# Patient Record
Sex: Female | Born: 1955 | Race: White | Hispanic: No | Smoking: Never smoker
Health system: Southern US, Community
[De-identification: ages and names within clinical notes are randomized; demographics above are authoritative.]

## PROBLEM LIST (undated history)

## (undated) DIAGNOSIS — E785 Hyperlipidemia, unspecified: Secondary | ICD-10-CM

## (undated) DIAGNOSIS — G4700 Insomnia, unspecified: Secondary | ICD-10-CM

## (undated) DIAGNOSIS — G5763 Lesion of plantar nerve, bilateral lower limbs: Secondary | ICD-10-CM

## (undated) DIAGNOSIS — M1990 Unspecified osteoarthritis, unspecified site: Secondary | ICD-10-CM

## (undated) DIAGNOSIS — T7840XA Allergy, unspecified, initial encounter: Secondary | ICD-10-CM

## (undated) DIAGNOSIS — F419 Anxiety disorder, unspecified: Secondary | ICD-10-CM

## (undated) DIAGNOSIS — B009 Herpesviral infection, unspecified: Secondary | ICD-10-CM

## (undated) HISTORY — DX: Herpesviral infection, unspecified: B00.9

## (undated) HISTORY — DX: Allergy, unspecified, initial encounter: T78.40XA

## (undated) HISTORY — DX: Unspecified osteoarthritis, unspecified site: M19.90

## (undated) HISTORY — DX: Hyperlipidemia, unspecified: E78.5

## (undated) HISTORY — DX: Anxiety disorder, unspecified: F41.9

## (undated) HISTORY — DX: Insomnia, unspecified: G47.00

## (undated) HISTORY — PX: EXCISION MORTON'S NEUROMA: SHX5013

## (undated) HISTORY — PX: COSMETIC SURGERY: SHX468

## (undated) HISTORY — PX: COLONOSCOPY: SHX174

## (undated) HISTORY — DX: Lesion of plantar nerve, bilateral lower limbs: G57.63

---

## 1986-12-13 HISTORY — PX: AUGMENTATION MAMMAPLASTY: SUR837

## 2013-03-14 DIAGNOSIS — F411 Generalized anxiety disorder: Secondary | ICD-10-CM | POA: Insufficient documentation

## 2014-03-26 DIAGNOSIS — A6000 Herpesviral infection of urogenital system, unspecified: Secondary | ICD-10-CM | POA: Insufficient documentation

## 2019-08-13 DIAGNOSIS — Z86010 Personal history of colonic polyps: Secondary | ICD-10-CM | POA: Insufficient documentation

## 2020-08-15 DIAGNOSIS — F1320 Sedative, hypnotic or anxiolytic dependence, uncomplicated: Secondary | ICD-10-CM | POA: Insufficient documentation

## 2020-12-09 ENCOUNTER — Other Ambulatory Visit: Payer: Self-pay

## 2020-12-09 ENCOUNTER — Encounter: Payer: Self-pay | Admitting: Medical-Surgical

## 2020-12-09 ENCOUNTER — Ambulatory Visit (INDEPENDENT_AMBULATORY_CARE_PROVIDER_SITE_OTHER): Payer: PPO | Admitting: Medical-Surgical

## 2020-12-09 VITALS — BP 123/76 | HR 70 | Temp 98.9°F | Ht 66.0 in | Wt 194.4 lb

## 2020-12-09 DIAGNOSIS — G5763 Lesion of plantar nerve, bilateral lower limbs: Secondary | ICD-10-CM | POA: Insufficient documentation

## 2020-12-09 DIAGNOSIS — J302 Other seasonal allergic rhinitis: Secondary | ICD-10-CM | POA: Insufficient documentation

## 2020-12-09 DIAGNOSIS — F5101 Primary insomnia: Secondary | ICD-10-CM | POA: Diagnosis not present

## 2020-12-09 DIAGNOSIS — B009 Herpesviral infection, unspecified: Secondary | ICD-10-CM | POA: Diagnosis not present

## 2020-12-09 DIAGNOSIS — Z7689 Persons encountering health services in other specified circumstances: Secondary | ICD-10-CM | POA: Diagnosis not present

## 2020-12-09 DIAGNOSIS — L659 Nonscarring hair loss, unspecified: Secondary | ICD-10-CM | POA: Diagnosis not present

## 2020-12-09 DIAGNOSIS — Z114 Encounter for screening for human immunodeficiency virus [HIV]: Secondary | ICD-10-CM

## 2020-12-09 DIAGNOSIS — E782 Mixed hyperlipidemia: Secondary | ICD-10-CM | POA: Diagnosis not present

## 2020-12-09 DIAGNOSIS — E785 Hyperlipidemia, unspecified: Secondary | ICD-10-CM | POA: Insufficient documentation

## 2020-12-09 DIAGNOSIS — Z0000 Encounter for general adult medical examination without abnormal findings: Secondary | ICD-10-CM | POA: Diagnosis not present

## 2020-12-09 DIAGNOSIS — Z1159 Encounter for screening for other viral diseases: Secondary | ICD-10-CM

## 2020-12-09 DIAGNOSIS — G4700 Insomnia, unspecified: Secondary | ICD-10-CM | POA: Insufficient documentation

## 2020-12-09 NOTE — Progress Notes (Signed)
New Patient Office Visit  Subjective:  Patient ID: Michelle Mathews, female    DOB: 12/19/1955  Age: 64 y.o. MRN: [id number]  CC:  Chief Complaint  Patient presents with  . Establish Care  . Hyperlipidemia    HPI Michelle Mathews presents to establish care.   Joint/back pain- recently moved and thinks her intermittent joint pains have been related to the increase in activity and lifting. Feeling okay today.   Hair loss- has noted some increased hair loss lately with some slow weight gain.   Insomnia- has recently officially retired and no longer adheres to a strict schedule. Occasional nights where falling asleep is difficult. Takes Lunesta 2mg  nightly as needed on average once weekly. Works very well.   Genital herpes- Takes Valtrex prn, very seldom outbreaks.   HLD- taking Lovastatin 10mg  daily, tolerating well without side effects. Due for lipid check.   Seasonal allergies- taking loratadine 10mg  daily for several years. If she stops, notes that she starts itching. Unsure what she may be allergic to in her environment. Knows she is sensitive to dust.   Past Medical History:  Diagnosis Date  . Herpes simplex   . Hyperlipidemia   . Insomnia   . Morton's neuroma of both feet     Past Surgical History:  Procedure Laterality Date  . BREAST ENHANCEMENT SURGERY    . COLONOSCOPY    . EXCISION MORTON'S NEUROMA Left     Family History  Problem Relation Age of Onset  . Cancer Neg Hx   . Hyperlipidemia Neg Hx   . Hypertension Neg Hx   . Anxiety disorder Neg Hx   . Depression Neg Hx   . Diabetes Neg Hx   . Heart disease Neg Hx   . Kidney disease Neg Hx     Social History   Socioeconomic History  . Marital status: Divorced    Spouse name: Not on file  . Number of children: Not on file  . Years of education: Not on file  . Highest education level: Not on file  Occupational History  . Not on file  Tobacco Use  . Smoking status: Never Smoker  . Smokeless tobacco: Never Used   Vaping Use  . Vaping Use: Never used  Substance and Sexual Activity  . Alcohol use: Yes    Alcohol/week: 1.0 - 2.0 standard drink    Types: 1 - 2 Standard drinks or equivalent per week  . Drug use: Never  . Sexual activity: Not Currently    Partners: Male    Birth control/protection: Post-menopausal  Other Topics Concern  . Not on file  Social History Narrative  . Not on file   Social Determinants of Health   Financial Resource Strain: Not on file  Food Insecurity: Not on file  Transportation Needs: Not on file  Physical Activity: Not on file  Stress: Not on file  Social Connections: Not on file  Intimate Partner Violence: Not on file    ROS Review of Systems  Constitutional: Negative for chills, fatigue, fever and unexpected weight change.  HENT: Negative for congestion, rhinorrhea, sinus pressure and sore throat.   Eyes: Negative for visual disturbance.  Respiratory: Negative for cough, chest tightness and shortness of breath.   Cardiovascular: Negative for chest pain, palpitations and leg swelling.  Gastrointestinal: Negative for abdominal pain, constipation, diarrhea, nausea and vomiting.  Endocrine: Negative for cold intolerance and heat intolerance.  Genitourinary: Negative for dysuria, frequency, urgency, vaginal bleeding and vaginal discharge.  Musculoskeletal: Positive  for arthralgias, back pain and myalgias.  Skin: Negative for rash and wound.  Neurological: Negative for dizziness, light-headedness and headaches.  Hematological: Does not bruise/bleed easily.  Psychiatric/Behavioral: Negative for self-injury, sleep disturbance and suicidal ideas. The patient is not nervous/anxious.     Objective:   Today's Vitals: BP 123/76   Pulse 70   Temp 98.9 F (37.2 C)   Ht 5\' 6"  (1.676 m)   Wt 194 lb 6.4 oz (88.2 kg)   SpO2 96%   BMI 31.38 kg/m   Physical Exam Vitals reviewed.  Constitutional:      General: She is not in acute distress.    Appearance:  Normal appearance.  HENT:     Head: Normocephalic and atraumatic.  Cardiovascular:     Rate and Rhythm: Normal rate and regular rhythm.     Pulses: Normal pulses.     Heart sounds: Normal heart sounds. No murmur heard. No friction rub. No gallop.   Pulmonary:     Effort: Pulmonary effort is normal. No respiratory distress.     Breath sounds: Normal breath sounds. No wheezing.  Skin:    General: Skin is warm and dry.  Neurological:     Mental Status: She is alert and oriented to person, place, and time.  Psychiatric:        Mood and Affect: Mood normal.        Behavior: Behavior normal.        Thought Content: Thought content normal.        Judgment: Judgment normal.     Assessment & Plan:   1. Encounter to establish care Reviewed available information and discussed health care concerns with patient.  We will be requesting records for further review and updated of the chart.  2. Primary insomnia Continue Lunesta 2 mg nightly as needed for sleep.  3. Mixed hyperlipidemia Continue lovastatin 10 mg daily.  Checking lipid panel. - Lipid panel  4. Herpes simplex Continue Valtrex as needed for outbreaks.  5. Hair loss Checking labs including TSH. - CBC - COMPLETE METABOLIC PANEL WITH GFR - TSH  6. Seasonal allergies Continue loratadine 10 mg daily.  If this seems less than effective, may benefit from switching to a different brand of over-the-counter antihistamine for several months before switching back to loratadine.  7. Preventative health care Checking CBCs, CMP, and lipid panel. - CBC - COMPLETE METABOLIC PANEL WITH GFR - Lipid panel  8. Need for hepatitis C screening test Discussed screening recommendations.  She is agreeable so we are adding hepatitis C screening to blood work today. - Hepatitis C antibody  9. Screening for HIV (human immunodeficiency virus) Discussed screening recommendations.  She is also agreeable to this so we are adding this to blood  work today. - HIV Antibody (routine testing w rflx)   Outpatient Encounter Medications as of 12/09/2020  Medication Sig  . CALCIUM PO Take 1 tablet by mouth daily.  . Collagen-Boron-Hyaluronic Acid (MOVE FREE ULTRA JOINT HEALTH) 40-5-3.3 MG TABS Take 1 tablet by mouth daily.  . eszopiclone (LUNESTA) 2 MG TABS tablet Take 2 mg by mouth daily as needed.  . loratadine (CLARITIN) 10 MG tablet Take 1 tablet by mouth daily.  Marland Kitchen lovastatin (MEVACOR) 10 MG tablet Take 10 mg by mouth daily.  . MULTIPLE VITAMIN PO Take 1 tablet by mouth daily.  . valACYclovir (VALTREX) 1000 MG tablet Take 1 tablet by mouth daily as needed.   No facility-administered encounter medications on file as of 12/09/2020.  Follow-up: Return in 6 months (on 06/09/2021) for insomnia/HLD follow up or sooner if needed.   Clearnce Sorrel, DNP, APRN, FNP-BC [facility]

## 2020-12-11 DIAGNOSIS — E782 Mixed hyperlipidemia: Secondary | ICD-10-CM | POA: Diagnosis not present

## 2020-12-11 DIAGNOSIS — Z114 Encounter for screening for human immunodeficiency virus [HIV]: Secondary | ICD-10-CM | POA: Diagnosis not present

## 2020-12-11 DIAGNOSIS — Z1159 Encounter for screening for other viral diseases: Secondary | ICD-10-CM | POA: Diagnosis not present

## 2020-12-11 DIAGNOSIS — Z0000 Encounter for general adult medical examination without abnormal findings: Secondary | ICD-10-CM | POA: Diagnosis not present

## 2020-12-11 DIAGNOSIS — L659 Nonscarring hair loss, unspecified: Secondary | ICD-10-CM | POA: Diagnosis not present

## 2020-12-15 LAB — COMPLETE METABOLIC PANEL WITH GFR
AG Ratio: 1.5 (calc) (ref 1.0–2.5)
ALT: 18 U/L (ref 6–29)
AST: 21 U/L (ref 10–35)
Albumin: 4 g/dL (ref 3.6–5.1)
Alkaline phosphatase (APISO): 66 U/L (ref 37–153)
BUN: 11 mg/dL (ref 7–25)
CO2: 30 mmol/L (ref 20–32)
Calcium: 9.8 mg/dL (ref 8.6–10.4)
Chloride: 105 mmol/L (ref 98–110)
Creat: 0.87 mg/dL (ref 0.50–0.99)
GFR, Est African American: 80 mL/min/{1.73_m2} (ref >=60)
GFR, Est Non African American: 69 mL/min/{1.73_m2} (ref >=60)
Globulin: 2.7 g/dL (calc) (ref 1.9–3.7)
Glucose, Bld: 94 mg/dL (ref 65–99)
Potassium: 4.5 mmol/L (ref 3.5–5.3)
Sodium: 140 mmol/L (ref 135–146)
Total Bilirubin: 0.3 mg/dL (ref 0.2–1.2)
Total Protein: 6.7 g/dL (ref 6.1–8.1)

## 2020-12-15 LAB — LIPID PANEL
Cholesterol: 216 mg/dL — ABNORMAL HIGH (ref <200)
HDL: 68 mg/dL (ref >=50)
LDL Cholesterol (Calc): 118 mg/dL (calc) — ABNORMAL HIGH
Non-HDL Cholesterol (Calc): 148 mg/dL (calc) — ABNORMAL HIGH (ref <130)
Total CHOL/HDL Ratio: 3.2 (calc) (ref <5.0)
Triglycerides: 180 mg/dL — ABNORMAL HIGH (ref <150)

## 2020-12-15 LAB — CBC
HCT: 46 % — ABNORMAL HIGH (ref 35.0–45.0)
Hemoglobin: 15.4 g/dL (ref 11.7–15.5)
MCH: 30.4 pg (ref 27.0–33.0)
MCHC: 33.5 g/dL (ref 32.0–36.0)
MCV: 90.9 fL (ref 80.0–100.0)
MPV: 11.8 fL (ref 7.5–12.5)
Platelets: 363 10*3/uL (ref 140–400)
RBC: 5.06 10*6/uL (ref 3.80–5.10)
RDW: 12.3 % (ref 11.0–15.0)
WBC: 10.1 10*3/uL (ref 3.8–10.8)

## 2020-12-15 LAB — TSH: TSH: 2.18 mIU/L (ref 0.40–4.50)

## 2020-12-15 LAB — HEPATITIS C ANTIBODY
Hepatitis C Ab: NONREACTIVE
SIGNAL TO CUT-OFF: 0.03 (ref <1.00)

## 2020-12-15 LAB — HIV ANTIBODY (ROUTINE TESTING W REFLEX): HIV 1&2 Ab, 4th Generation: NONREACTIVE

## 2020-12-29 ENCOUNTER — Encounter: Payer: PPO | Admitting: Medical-Surgical

## 2020-12-29 NOTE — Progress Notes (Signed)
Erroneous encounter

## 2021-03-09 ENCOUNTER — Ambulatory Visit (INDEPENDENT_AMBULATORY_CARE_PROVIDER_SITE_OTHER): Payer: PPO | Admitting: Medical-Surgical

## 2021-03-09 DIAGNOSIS — Z0000 Encounter for general adult medical examination without abnormal findings: Secondary | ICD-10-CM | POA: Diagnosis not present

## 2021-03-09 DIAGNOSIS — Z1231 Encounter for screening mammogram for malignant neoplasm of breast: Secondary | ICD-10-CM

## 2021-03-09 NOTE — Progress Notes (Signed)
MEDICARE ANNUAL WELLNESS VISIT  03/09/2021  Telephone Visit Disclaimer This Medicare AWV was conducted by telephone due to national recommendations for restrictions regarding the COVID-19 Pandemic (e.g. social distancing).  I verified, using two identifiers, that I am speaking with Michelle Mathews or their authorized healthcare agent. I discussed the limitations, risks, security, and privacy concerns of performing an evaluation and management service by telephone and the potential availability of an in-person appointment in the future. The patient expressed understanding and agreed to proceed.  Location of Patient: Home Location of Provider (nurse):  In the office.  Subjective:    Michelle Mathews is a 65 y.o. female patient of Samuel Bouche, NP who had a Medicare Annual Wellness Visit today via telephone. Michelle Mathews is Retired and lives alone. she has 0 children. she reports that she is socially active and does interact with friends/family regularly. she is minimally physically active and enjoys sewing.  Patient Care Team: Samuel Bouche, NP as PCP - General (Nurse Practitioner)  Advanced Directives 03/09/2021 12/09/2020  Does Patient Have a Medical Advance Directive? Yes Yes  Type of Paramedic of [place];Living will [place];Living will  Does patient want to make changes to medical advance directive? No - Patient declined No - Patient declined  Copy of [place] in Chart? No - copy requested No - copy requested    Hospital Utilization Over the Past 12 Months: # of hospitalizations or ER visits: 0 # of surgeries: 0  Review of Systems    Patient reports that her overall health is unchanged compared to last year.  History obtained from chart review and the patient  Patient Reported Readings (BP, Pulse, CBG, Weight, etc) none  Pain Assessment Pain : No/denies pain     Current Medications & Allergies (verified) Allergies as of  03/09/2021      Reactions   Bee Venom Swelling   Latex Rash   Sunflower Oil Other (See Comments)   Tickling feeling in mouth   Wasp Venom Protein Swelling      Medication List       Accurate as of March 09, 2021 10:33 AM. If you have any questions, ask your nurse or doctor.        CALCIUM PO Take 1 tablet by mouth daily.   cetirizine 10 MG tablet Commonly known as: ZYRTEC Take 10 mg by mouth daily.   eszopiclone 2 MG Tabs tablet Commonly known as: LUNESTA Take 2 mg by mouth daily as needed.   loratadine 10 MG tablet Commonly known as: CLARITIN Take 1 tablet by mouth daily.   lovastatin 10 MG tablet Commonly known as: MEVACOR Take 10 mg by mouth daily.   Move Free Ultra Joint Health 40-5-3.3 MG Tabs Generic drug: Collagen-Boron-Hyaluronic Acid Take 1 tablet by mouth daily.   MULTIPLE VITAMIN PO Take 1 tablet by mouth daily.   valACYclovir 1000 MG tablet Commonly known as: VALTREX Take 1 tablet by mouth daily as needed.       History (reviewed): Past Medical History:  Diagnosis Date  . Herpes simplex   . Hyperlipidemia   . Insomnia   . Morton's neuroma of both feet    Past Surgical History:  Procedure Laterality Date  . BREAST ENHANCEMENT SURGERY    . COLONOSCOPY    . EXCISION MORTON'S NEUROMA Left    Family History  Problem Relation Age of Onset  . Cancer Neg Hx   . Hyperlipidemia Neg Hx   . Hypertension  Neg Hx   . Anxiety disorder Neg Hx   . Depression Neg Hx   . Diabetes Neg Hx   . Heart disease Neg Hx   . Kidney disease Neg Hx    Social History   Socioeconomic History  . Marital status: Divorced    Spouse name: Not on file  . Number of children: Not on file  . Years of education: 84  . Highest education level: Bachelor's degree (e.g., BA, AB, BS)  Occupational History    Comment: Retired  Tobacco Use  . Smoking status: Never Smoker  . Smokeless tobacco: Never Used  Vaping Use  . Vaping Use: Never used  Substance and Sexual  Activity  . Alcohol use: Yes    Alcohol/week: 1.0 - 2.0 standard drink    Types: 1 - 2 Standard drinks or equivalent per week    Comment: week  . Drug use: Never  . Sexual activity: Not Currently    Partners: Male    Birth control/protection: Post-menopausal  Other Topics Concern  . Not on file  Social History Narrative   Lives alone. 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 She has a little dog that she walks twice daily.    Social Determinants of Health   Financial Resource Strain: Low Risk   . Difficulty of Paying Living Expenses: Not hard at all  Food Insecurity: No Food Insecurity  . Worried About Charity fundraiser in the Last Year: Never true  . Ran Out of Food in the Last Year: Never true  Transportation Needs: No Transportation Needs  . Lack of Transportation (Medical): No  . Lack of Transportation (Non-Medical): No  Physical Activity: Inactive  . Days of Exercise per Week: 0 days  . Minutes of Exercise per Session: 0 min  Stress: No Stress Concern Present  . Feeling of Stress : Not at all  Social Connections: Socially Isolated  . Frequency of Communication with Friends and Family: Never  . Frequency of Social Gatherings with Friends and Family: Once a week  . Attends Religious Services: Never  . Active Member of Clubs or Organizations: No  . Attends Archivist Meetings: Never  . Marital Status: Divorced    Activities of Daily Living In your present state of health, do you have any difficulty performing the following activities: 03/09/2021 12/09/2020  Hearing? N N  Vision? N N  Difficulty concentrating or making decisions? N N  Walking or climbing stairs? N N  Dressing or bathing? N N  Doing errands, shopping? N N  Preparing Food and eating ? N -  Using the Toilet? N -  In the past six months, have you accidently leaked urine? N -  Do you have problems with loss of bowel control? N -  Managing your Medications? N -  Managing your  Finances? N -  Housekeeping or managing your Housekeeping? N -    Patient Education/ Literacy How often do you need to have someone help you when you read instructions, pamphlets, or other written materials from your doctor or pharmacy?: 1 - Never What is the last grade level you completed in school?: Bachelor's degree  Exercise Current Exercise Habits: Home exercise routine, Type of exercise: walking, Time (Minutes): 20, Frequency (Times/Week): 7, Weekly Exercise (Minutes/Week): 140, Intensity: Mild, Exercise limited by: None identified  Diet Patient reports consuming 2 meals a day and 2 snack(s) a day Patient reports that her primary diet is: Regular Patient reports that  she does have regular access to food.   Depression Screen PHQ 2/9 Scores 03/09/2021 12/09/2020  PHQ - 2 Score 0 0  PHQ- 9 Score - 2     Fall Risk Fall Risk  03/09/2021 12/09/2020  Falls in the past year? 1 0  Number falls in past yr: 0 0  Injury with Fall? 0 0  Risk for fall due to : No Fall Risks -  Follow up Falls evaluation completed Falls evaluation completed     Objective:  Michelle Mathews seemed alert and oriented and she participated appropriately during our telephone visit.  Blood Pressure Weight BMI  BP Readings from Last 3 Encounters:  12/09/20 123/76   Wt Readings from Last 3 Encounters:  12/09/20 194 lb 6.4 oz (88.2 kg)   BMI Readings from Last 1 Encounters:  12/09/20 31.38 kg/m    *Unable to obtain current vital signs, weight, and BMI due to telephone visit type  Hearing/Vision  . Daja did not seem to have difficulty with hearing/understanding during the telephone conversation . Reports that she has had a formal eye exam by an eye care professional within the past year . Reports that she has not had a formal hearing evaluation within the past year *Unable to fully assess hearing and vision during telephone visit type  Cognitive Function: 6CIT Screen 03/09/2021  What Year? 0 points  What  month? 0 points  What time? 0 points  Count back from 20 0 points  Months in reverse 0 points  Repeat phrase 0 points  Total Score 0   (Normal:0-7, Significant for Dysfunction: >8)  Normal Cognitive Function Screening: Yes   Immunization & Health Maintenance Record Immunization History  Administered Date(s) Administered  . Influenza-Unspecified 09/08/2015, 09/13/2016, 09/20/2017, 09/26/2020  . Moderna Sars-Covid-2 Vaccination 01/26/2020, 02/24/2020, 09/26/2020  . Pneumococcal Polysaccharide-23 09/10/2019  . Tdap 09/18/2014  . Zoster 09/10/2019, 11/09/2019    Health Maintenance  Topic Date Due  . PNA vac Low Risk Adult (2 of 2 - PCV13) 12/09/2021 (Originally 09/09/2020)  . COLONOSCOPY (Pts 45-90yrs Insurance coverage will need to be confirmed)  07/01/2021  . MAMMOGRAM  05/23/2022  . TETANUS/TDAP  09/18/2024  . INFLUENZA VACCINE  Completed  . DEXA SCAN  Completed  . COVID-19 Vaccine  Completed  . Hepatitis C Screening  Completed  . HPV VACCINES  Aged Out       Assessment  This is a routine wellness examination for Dave Mannes.  Health Maintenance: Due or Overdue There are no preventive care reminders to display for this patient.  Taygan Losey does not need a referral for Community Assistance: Care Management:   no Social Work:    no Prescription Assistance:  no Nutrition/Diabetes Education:  no   Plan:  Personalized Goals Goals Addressed              This Visit's Progress   .  Patient Stated (pt-stated)        03/09/2021 AWV Goal: Exercise for General Health   Patient will verbalize understanding of the benefits of increased physical activity:  Exercising regularly is important. It will improve your overall fitness, flexibility, and endurance.  Regular exercise also will improve your overall health. It can help you control your weight, reduce stress, and improve your bone density.  Over the next year, patient will increase physical activity as tolerated  with a goal of at least 150 minutes of moderate physical activity per week.   You can tell that you are exercising at a  moderate intensity if your heart starts beating faster and you start breathing faster but can still hold a conversation.  Moderate-intensity exercise ideas include:  Walking 1 mile (1.6 km) in about 15 minutes  Biking  Hiking  Golfing  Dancing  Water aerobics  Patient will verbalize understanding of everyday activities that increase physical activity by providing examples like the following: ? Yard work, such as: ? Pushing a Conservation officer, nature ? Raking and bagging leaves ? Washing your car ? Pushing a stroller ? Shoveling snow ? Gardening ? Washing windows or floors  Patient will be able to explain general safety guidelines for exercising:   Before you start a new exercise program, talk with your health care provider.  Do not exercise so much that you hurt yourself, feel dizzy, or get very short of breath.  Wear comfortable clothes and wear shoes with good support.  Drink plenty of water while you exercise to prevent dehydration or heat stroke.  Work out until your breathing and your heartbeat get faster.       Personalized Health Maintenance & Screening Recommendations  Pneumococcal vaccine  Shingles vaccine  Lung Cancer Screening Recommended: no (Low Dose CT Chest recommended if Age 58-80 years, 30 pack-year currently smoking OR have quit w/in past 15 years) Hepatitis C Screening recommended: no HIV Screening recommended: no  Advanced Directives: Written information was not prepared per patient's request.  Referrals & Orders Orders Placed This Encounter  Procedures  . Mammogram 3D SCREEN BREAST BILATERAL    Follow-up Plan . Follow-up with Samuel Bouche, NP as planned . Schedule your nurse visit for your Pneumonia vaccine or it can be done at your next in-office visit. . Schedule your shingles vaccine appointment at your pharmacy.  . Mammogram  referral has been sent and they will call you to schedule.  . Medicare wellness in one year.   I have personally reviewed and noted the following in the patient's chart:   . Medical and social history . Use of alcohol, tobacco or illicit drugs  . Current medications and supplements . Functional ability and status . Nutritional status . Physical activity . Advanced directives . List of other physicians . Hospitalizations, surgeries, and ER visits in previous 12 months . Vitals . Screenings to include cognitive, depression, and falls . Referrals and appointments  In addition, I have reviewed and discussed with Michelle Mathews certain preventive protocols, quality metrics, and best practice recommendations. A written personalized care plan for preventive services as well as general preventive health recommendations is available and can be mailed to the patient at her request.      Tinnie Gens, RN  03/09/2021

## 2021-03-09 NOTE — Patient Instructions (Addendum)
[place] Maintenance Summary and Written Plan of Care  Ms. Michelle Mathews ,  Thank you for allowing me to perform your Medicare Annual Wellness Visit and for your ongoing commitment to your health.   Health Maintenance & Immunization History Health Maintenance  Topic Date Due  . PNA vac Low Risk Adult (2 of 2 - PCV13) 12/09/2021 (Originally 09/09/2020)  . COLONOSCOPY (Pts 45-32yrs Insurance coverage will need to be confirmed)  07/01/2021  . MAMMOGRAM  05/23/2022  . TETANUS/TDAP  09/18/2024  . INFLUENZA VACCINE  Completed  . DEXA SCAN  Completed  . COVID-19 Vaccine  Completed  . Hepatitis C Screening  Completed  . HPV VACCINES  Aged Out   Immunization History  Administered Date(s) Administered  . Influenza-Unspecified 09/08/2015, 09/13/2016, 09/20/2017, 09/26/2020  . Moderna Sars-Covid-2 Vaccination 01/26/2020, 02/24/2020, 09/26/2020  . Pneumococcal Polysaccharide-23 09/10/2019  . Tdap 09/18/2014  . Zoster 09/10/2019, 11/09/2019    These are the patient goals that we discussed: Goals Addressed              This Visit's Progress   .  Patient Stated (pt-stated)        03/09/2021 AWV Goal: Exercise for General Health   Patient will verbalize understanding of the benefits of increased physical activity:  Exercising regularly is important. It will improve your overall fitness, flexibility, and endurance.  Regular exercise also will improve your overall health. It can help you control your weight, reduce stress, and improve your bone density.  Over the next year, patient will increase physical activity as tolerated with a goal of at least 150 minutes of moderate physical activity per week.   You can tell that you are exercising at a moderate intensity if your heart starts beating faster and you start breathing faster but can still hold a conversation.  Moderate-intensity exercise ideas include:  Walking 1 mile (1.6 km) in about 15  minutes  Biking  Hiking  Golfing  Dancing  Water aerobics  Patient will verbalize understanding of everyday activities that increase physical activity by providing examples like the following: ? Yard work, such as: ? Pushing a Conservation officer, nature ? Raking and bagging leaves ? Washing your car ? Pushing a stroller ? Shoveling snow ? Gardening ? Washing windows or floors  Patient will be able to explain general safety guidelines for exercising:   Before you start a new exercise program, talk with your health care provider.  Do not exercise so much that you hurt yourself, feel dizzy, or get very short of breath.  Wear comfortable clothes and wear shoes with good support.  Drink plenty of water while you exercise to prevent dehydration or heat stroke.  Work out until your breathing and your heartbeat get faster.         This is a list of Health Maintenance Items that are overdue or due now: Pneumococcal vaccine  Shingles vaccine  Orders/Referrals Placed Today: No orders of the defined types were placed in this encounter.  (Contact our referral department at [phone] if you have not spoken with someone about your referral appointment within the next 5 days)    Follow-up Plan . Follow-up with Samuel Bouche, NP as planned . Schedule your nurse visit for your Pneumonia vaccine or it can be done at your next in-office visit. . Schedule your shingles vaccine appointment at your pharmacy.  . Mammogram referral has been sent and they will call you to schedule.  . Medicare wellness in one year.  Health Maintenance, Female Adopting a healthy lifestyle and getting preventive care are important in promoting health and wellness. Ask your health care provider about:  The right schedule for you to have regular tests and exams.  Things you can do on your own to prevent diseases and keep yourself healthy. What should I know about diet, weight, and exercise? Eat a healthy  diet  Eat a diet that includes plenty of vegetables, fruits, low-fat dairy products, and lean protein.  Do not eat a lot of foods that are high in solid fats, added sugars, or sodium.   Maintain a healthy weight Body mass index (BMI) is used to identify weight problems. It estimates body fat based on height and weight. Your health care provider can help determine your BMI and help you achieve or maintain a healthy weight. Get regular exercise Get regular exercise. This is one of the most important things you can do for your health. Most adults should:  Exercise for at least 150 minutes each week. The exercise should increase your heart rate and make you sweat (moderate-intensity exercise).  Do strengthening exercises at least twice a week. This is in addition to the moderate-intensity exercise.  Spend less time sitting. Even light physical activity can be beneficial. Watch cholesterol and blood lipids Have your blood tested for lipids and cholesterol at 67 years of age, then have this test every 5 years. Have your cholesterol levels checked more often if:  Your lipid or cholesterol levels are high.  You are older than 67 years of age.  You are at high risk for heart disease. What should I know about cancer screening? Depending on your health history and family history, you may need to have cancer screening at various ages. This may include screening for:  Breast cancer.  Cervical cancer.  Colorectal cancer.  Skin cancer.  Lung cancer. What should I know about heart disease, diabetes, and high blood pressure? Blood pressure and heart disease  High blood pressure causes heart disease and increases the risk of stroke. This is more likely to develop in people who have high blood pressure readings, are of African descent, or are overweight.  Have your blood pressure checked: ? Every 3-5 years if you are 37-106 years of age. ? Every year if you are 47 years old or  older. Diabetes Have regular diabetes screenings. This checks your fasting blood sugar level. Have the screening done:  Once every three years after age 63 if you are at a normal weight and have a low risk for diabetes.  More often and at a younger age if you are overweight or have a high risk for diabetes. What should I know about preventing infection? Hepatitis B If you have a higher risk for hepatitis B, you should be screened for this virus. Talk with your health care provider to find out if you are at risk for hepatitis B infection. Hepatitis C Testing is recommended for:  Everyone born from [birth year] through 1967.  Anyone with known risk factors for hepatitis C. Sexually transmitted infections (STIs)  Get screened for STIs, including gonorrhea and chlamydia, if: ? You are sexually active and are younger than 67 years of age. ? You are older than 67 years of age and your health care provider tells you that you are at risk for this type of infection. ? Your sexual activity has changed since you were last screened, and you are at increased risk for chlamydia or gonorrhea. Ask your health care  provider if you are at risk.  Ask your health care provider about whether you are at high risk for HIV. Your health care provider may recommend a prescription medicine to help prevent HIV infection. If you choose to take medicine to prevent HIV, you should first get tested for HIV. You should then be tested every 3 months for as long as you are taking the medicine. Pregnancy  If you are about to stop having your period (premenopausal) and you may become pregnant, seek counseling before you get pregnant.  Take 400 to 800 micrograms (mcg) of folic acid every day if you become pregnant.  Ask for birth control (contraception) if you want to prevent pregnancy. Osteoporosis and menopause Osteoporosis is a disease in which the bones lose minerals and strength with aging. This can result in bone fractures.  If you are 49 years old or older, or if you are at risk for osteoporosis and fractures, ask your health care provider if you should:  Be screened for bone loss.  Take a calcium or vitamin D supplement to lower your risk of fractures.  Be given hormone replacement therapy (HRT) to treat symptoms of menopause. Follow these instructions at home: Lifestyle  Do not use any products that contain nicotine or tobacco, such as cigarettes, e-cigarettes, and chewing tobacco. If you need help quitting, ask your health care provider.  Do not use street drugs.  Do not share needles.  Ask your health care provider for help if you need support or information about quitting drugs. Alcohol use  Do not drink alcohol if: ? Your health care provider tells you not to drink. ? You are pregnant, may be pregnant, or are planning to become pregnant.  If you drink alcohol: ? Limit how much you use to 0-1 drink a day. ? Limit intake if you are breastfeeding.  Be aware of how much alcohol is in your drink. In the [place] one drink equals one 12 oz bottle of beer (355 mL), one 5 oz glass of wine (148 mL), or one 1 oz glass of hard liquor (44 mL). General instructions  Schedule regular health, dental, and eye exams.  Stay current with your vaccines.  Tell your health care provider if: ? You often feel depressed. ? You have ever been abused or do not feel safe at home. Summary  Adopting a healthy lifestyle and getting preventive care are important in promoting health and wellness.  Follow your health care provider's instructions about healthy diet, exercising, and getting tested or screened for diseases.  Follow your health care provider's instructions on monitoring your cholesterol and blood pressure. This information is not intended to replace advice given to you by your health care provider. Make sure you discuss any questions you have with your health care provider. Document Revised: 11/22/2018  Document Reviewed: 11/22/2018 Elsevier Patient Education  2021 [employer].

## 2021-04-13 DIAGNOSIS — M9902 Segmental and somatic dysfunction of thoracic region: Secondary | ICD-10-CM | POA: Diagnosis not present

## 2021-04-14 DIAGNOSIS — M9902 Segmental and somatic dysfunction of thoracic region: Secondary | ICD-10-CM | POA: Diagnosis not present

## 2021-04-16 DIAGNOSIS — M9902 Segmental and somatic dysfunction of thoracic region: Secondary | ICD-10-CM | POA: Diagnosis not present

## 2021-04-20 DIAGNOSIS — M9902 Segmental and somatic dysfunction of thoracic region: Secondary | ICD-10-CM | POA: Diagnosis not present

## 2021-04-23 DIAGNOSIS — M9902 Segmental and somatic dysfunction of thoracic region: Secondary | ICD-10-CM | POA: Diagnosis not present

## 2021-05-12 DIAGNOSIS — M4724 Other spondylosis with radiculopathy, thoracic region: Secondary | ICD-10-CM | POA: Diagnosis not present

## 2021-05-12 DIAGNOSIS — M47816 Spondylosis without myelopathy or radiculopathy, lumbar region: Secondary | ICD-10-CM | POA: Diagnosis not present

## 2021-05-12 DIAGNOSIS — M9902 Segmental and somatic dysfunction of thoracic region: Secondary | ICD-10-CM | POA: Diagnosis not present

## 2021-05-12 DIAGNOSIS — M9903 Segmental and somatic dysfunction of lumbar region: Secondary | ICD-10-CM | POA: Diagnosis not present

## 2021-05-14 DIAGNOSIS — M9903 Segmental and somatic dysfunction of lumbar region: Secondary | ICD-10-CM | POA: Diagnosis not present

## 2021-05-14 DIAGNOSIS — M9902 Segmental and somatic dysfunction of thoracic region: Secondary | ICD-10-CM | POA: Diagnosis not present

## 2021-05-14 DIAGNOSIS — M4724 Other spondylosis with radiculopathy, thoracic region: Secondary | ICD-10-CM | POA: Diagnosis not present

## 2021-05-14 DIAGNOSIS — M47816 Spondylosis without myelopathy or radiculopathy, lumbar region: Secondary | ICD-10-CM | POA: Diagnosis not present

## 2021-05-18 DIAGNOSIS — M9903 Segmental and somatic dysfunction of lumbar region: Secondary | ICD-10-CM | POA: Diagnosis not present

## 2021-05-18 DIAGNOSIS — M9902 Segmental and somatic dysfunction of thoracic region: Secondary | ICD-10-CM | POA: Diagnosis not present

## 2021-05-18 DIAGNOSIS — M4724 Other spondylosis with radiculopathy, thoracic region: Secondary | ICD-10-CM | POA: Diagnosis not present

## 2021-05-18 DIAGNOSIS — M47816 Spondylosis without myelopathy or radiculopathy, lumbar region: Secondary | ICD-10-CM | POA: Diagnosis not present

## 2021-05-19 DIAGNOSIS — M4724 Other spondylosis with radiculopathy, thoracic region: Secondary | ICD-10-CM | POA: Diagnosis not present

## 2021-05-19 DIAGNOSIS — M9902 Segmental and somatic dysfunction of thoracic region: Secondary | ICD-10-CM | POA: Diagnosis not present

## 2021-05-19 DIAGNOSIS — M9903 Segmental and somatic dysfunction of lumbar region: Secondary | ICD-10-CM | POA: Diagnosis not present

## 2021-05-19 DIAGNOSIS — M47816 Spondylosis without myelopathy or radiculopathy, lumbar region: Secondary | ICD-10-CM | POA: Diagnosis not present

## 2021-05-20 DIAGNOSIS — M47816 Spondylosis without myelopathy or radiculopathy, lumbar region: Secondary | ICD-10-CM | POA: Diagnosis not present

## 2021-05-20 DIAGNOSIS — M9903 Segmental and somatic dysfunction of lumbar region: Secondary | ICD-10-CM | POA: Diagnosis not present

## 2021-05-20 DIAGNOSIS — M4724 Other spondylosis with radiculopathy, thoracic region: Secondary | ICD-10-CM | POA: Diagnosis not present

## 2021-05-20 DIAGNOSIS — M9902 Segmental and somatic dysfunction of thoracic region: Secondary | ICD-10-CM | POA: Diagnosis not present

## 2021-05-21 DIAGNOSIS — M4724 Other spondylosis with radiculopathy, thoracic region: Secondary | ICD-10-CM | POA: Diagnosis not present

## 2021-05-21 DIAGNOSIS — M9903 Segmental and somatic dysfunction of lumbar region: Secondary | ICD-10-CM | POA: Diagnosis not present

## 2021-05-21 DIAGNOSIS — M9902 Segmental and somatic dysfunction of thoracic region: Secondary | ICD-10-CM | POA: Diagnosis not present

## 2021-05-21 DIAGNOSIS — M47816 Spondylosis without myelopathy or radiculopathy, lumbar region: Secondary | ICD-10-CM | POA: Diagnosis not present

## 2021-05-25 DIAGNOSIS — M4724 Other spondylosis with radiculopathy, thoracic region: Secondary | ICD-10-CM | POA: Diagnosis not present

## 2021-05-25 DIAGNOSIS — M9903 Segmental and somatic dysfunction of lumbar region: Secondary | ICD-10-CM | POA: Diagnosis not present

## 2021-05-25 DIAGNOSIS — M47816 Spondylosis without myelopathy or radiculopathy, lumbar region: Secondary | ICD-10-CM | POA: Diagnosis not present

## 2021-05-25 DIAGNOSIS — M9902 Segmental and somatic dysfunction of thoracic region: Secondary | ICD-10-CM | POA: Diagnosis not present

## 2021-05-27 DIAGNOSIS — M4724 Other spondylosis with radiculopathy, thoracic region: Secondary | ICD-10-CM | POA: Diagnosis not present

## 2021-05-27 DIAGNOSIS — M47816 Spondylosis without myelopathy or radiculopathy, lumbar region: Secondary | ICD-10-CM | POA: Diagnosis not present

## 2021-05-27 DIAGNOSIS — M9903 Segmental and somatic dysfunction of lumbar region: Secondary | ICD-10-CM | POA: Diagnosis not present

## 2021-05-27 DIAGNOSIS — M9902 Segmental and somatic dysfunction of thoracic region: Secondary | ICD-10-CM | POA: Diagnosis not present

## 2021-06-01 DIAGNOSIS — M4724 Other spondylosis with radiculopathy, thoracic region: Secondary | ICD-10-CM | POA: Diagnosis not present

## 2021-06-01 DIAGNOSIS — M9903 Segmental and somatic dysfunction of lumbar region: Secondary | ICD-10-CM | POA: Diagnosis not present

## 2021-06-01 DIAGNOSIS — M47816 Spondylosis without myelopathy or radiculopathy, lumbar region: Secondary | ICD-10-CM | POA: Diagnosis not present

## 2021-06-01 DIAGNOSIS — M9902 Segmental and somatic dysfunction of thoracic region: Secondary | ICD-10-CM | POA: Diagnosis not present

## 2021-06-04 DIAGNOSIS — M4724 Other spondylosis with radiculopathy, thoracic region: Secondary | ICD-10-CM | POA: Diagnosis not present

## 2021-06-04 DIAGNOSIS — M47816 Spondylosis without myelopathy or radiculopathy, lumbar region: Secondary | ICD-10-CM | POA: Diagnosis not present

## 2021-06-04 DIAGNOSIS — M9902 Segmental and somatic dysfunction of thoracic region: Secondary | ICD-10-CM | POA: Diagnosis not present

## 2021-06-04 DIAGNOSIS — M9903 Segmental and somatic dysfunction of lumbar region: Secondary | ICD-10-CM | POA: Diagnosis not present

## 2021-06-08 DIAGNOSIS — M9903 Segmental and somatic dysfunction of lumbar region: Secondary | ICD-10-CM | POA: Diagnosis not present

## 2021-06-08 DIAGNOSIS — M47816 Spondylosis without myelopathy or radiculopathy, lumbar region: Secondary | ICD-10-CM | POA: Diagnosis not present

## 2021-06-08 DIAGNOSIS — M9902 Segmental and somatic dysfunction of thoracic region: Secondary | ICD-10-CM | POA: Diagnosis not present

## 2021-06-08 DIAGNOSIS — M4724 Other spondylosis with radiculopathy, thoracic region: Secondary | ICD-10-CM | POA: Diagnosis not present

## 2021-06-11 DIAGNOSIS — M47816 Spondylosis without myelopathy or radiculopathy, lumbar region: Secondary | ICD-10-CM | POA: Diagnosis not present

## 2021-06-11 DIAGNOSIS — M4724 Other spondylosis with radiculopathy, thoracic region: Secondary | ICD-10-CM | POA: Diagnosis not present

## 2021-06-11 DIAGNOSIS — M9902 Segmental and somatic dysfunction of thoracic region: Secondary | ICD-10-CM | POA: Diagnosis not present

## 2021-06-11 DIAGNOSIS — M9903 Segmental and somatic dysfunction of lumbar region: Secondary | ICD-10-CM | POA: Diagnosis not present

## 2021-06-24 DIAGNOSIS — M9903 Segmental and somatic dysfunction of lumbar region: Secondary | ICD-10-CM | POA: Diagnosis not present

## 2021-06-24 DIAGNOSIS — M9902 Segmental and somatic dysfunction of thoracic region: Secondary | ICD-10-CM | POA: Diagnosis not present

## 2021-06-24 DIAGNOSIS — M4724 Other spondylosis with radiculopathy, thoracic region: Secondary | ICD-10-CM | POA: Diagnosis not present

## 2021-06-24 DIAGNOSIS — M47816 Spondylosis without myelopathy or radiculopathy, lumbar region: Secondary | ICD-10-CM | POA: Diagnosis not present

## 2021-07-01 DIAGNOSIS — M9903 Segmental and somatic dysfunction of lumbar region: Secondary | ICD-10-CM | POA: Diagnosis not present

## 2021-07-01 DIAGNOSIS — M47816 Spondylosis without myelopathy or radiculopathy, lumbar region: Secondary | ICD-10-CM | POA: Diagnosis not present

## 2021-07-01 DIAGNOSIS — M4724 Other spondylosis with radiculopathy, thoracic region: Secondary | ICD-10-CM | POA: Diagnosis not present

## 2021-07-01 DIAGNOSIS — M9902 Segmental and somatic dysfunction of thoracic region: Secondary | ICD-10-CM | POA: Diagnosis not present

## 2021-07-08 DIAGNOSIS — M47816 Spondylosis without myelopathy or radiculopathy, lumbar region: Secondary | ICD-10-CM | POA: Diagnosis not present

## 2021-07-08 DIAGNOSIS — M9903 Segmental and somatic dysfunction of lumbar region: Secondary | ICD-10-CM | POA: Diagnosis not present

## 2021-07-08 DIAGNOSIS — M9902 Segmental and somatic dysfunction of thoracic region: Secondary | ICD-10-CM | POA: Diagnosis not present

## 2021-07-08 DIAGNOSIS — M4724 Other spondylosis with radiculopathy, thoracic region: Secondary | ICD-10-CM | POA: Diagnosis not present

## 2021-07-23 DIAGNOSIS — M9903 Segmental and somatic dysfunction of lumbar region: Secondary | ICD-10-CM | POA: Diagnosis not present

## 2021-07-23 DIAGNOSIS — M4724 Other spondylosis with radiculopathy, thoracic region: Secondary | ICD-10-CM | POA: Diagnosis not present

## 2021-07-23 DIAGNOSIS — M9902 Segmental and somatic dysfunction of thoracic region: Secondary | ICD-10-CM | POA: Diagnosis not present

## 2021-07-23 DIAGNOSIS — M47816 Spondylosis without myelopathy or radiculopathy, lumbar region: Secondary | ICD-10-CM | POA: Diagnosis not present

## 2021-08-06 ENCOUNTER — Other Ambulatory Visit: Payer: Self-pay | Admitting: Medical-Surgical

## 2021-08-06 ENCOUNTER — Other Ambulatory Visit: Payer: Self-pay

## 2021-08-06 ENCOUNTER — Ambulatory Visit (INDEPENDENT_AMBULATORY_CARE_PROVIDER_SITE_OTHER): Payer: PPO

## 2021-08-06 ENCOUNTER — Encounter: Payer: Self-pay | Admitting: Medical-Surgical

## 2021-08-06 DIAGNOSIS — Z1231 Encounter for screening mammogram for malignant neoplasm of breast: Secondary | ICD-10-CM

## 2021-08-06 DIAGNOSIS — Z0000 Encounter for general adult medical examination without abnormal findings: Secondary | ICD-10-CM

## 2021-08-06 NOTE — Telephone Encounter (Signed)
We have not prescribed this medication for the patient previously.  Please review and refill if appropriate.  T. Isidore Margraf, CMA  

## 2021-08-13 ENCOUNTER — Other Ambulatory Visit: Payer: Self-pay

## 2021-08-13 DIAGNOSIS — E782 Mixed hyperlipidemia: Secondary | ICD-10-CM

## 2021-08-13 NOTE — Progress Notes (Signed)
Pt requested to have lipid panel done early in the morning so she doesn't have to fast too long.

## 2021-08-14 ENCOUNTER — Ambulatory Visit (INDEPENDENT_AMBULATORY_CARE_PROVIDER_SITE_OTHER): Payer: PPO | Admitting: Medical-Surgical

## 2021-08-14 ENCOUNTER — Encounter: Payer: Self-pay | Admitting: Medical-Surgical

## 2021-08-14 ENCOUNTER — Other Ambulatory Visit: Payer: Self-pay

## 2021-08-14 VITALS — BP 112/68 | HR 94 | Ht 66.0 in | Wt 199.0 lb

## 2021-08-14 DIAGNOSIS — Z1211 Encounter for screening for malignant neoplasm of colon: Secondary | ICD-10-CM

## 2021-08-14 DIAGNOSIS — Z23 Encounter for immunization: Secondary | ICD-10-CM

## 2021-08-14 DIAGNOSIS — E782 Mixed hyperlipidemia: Secondary | ICD-10-CM

## 2021-08-14 DIAGNOSIS — K635 Polyp of colon: Secondary | ICD-10-CM | POA: Insufficient documentation

## 2021-08-14 MED ORDER — LOVASTATIN 10 MG PO TABS: 10.0000 mg | ORAL_TABLET | Freq: Every day | ORAL | 1 refills | Status: DC

## 2021-08-14 NOTE — Progress Notes (Signed)
  HPI with pertinent ROS:   CC: HLD follow-up  HPI: Pleasant 65 year old female presenting today for follow-up on hyperlipidemia.  She has been taking lovastatin 10 mg daily as prescribed, tolerating well without side effects.  Working to make healthier dietary choices and has started exercising by doing pickleball each week.  Denies chest pain, shortness of breath, headaches, dizziness, lower extremity edema, and paresthesias.  She has a history of colon polyps and is overdue for a colonoscopy.  She is new to the area and does not know where she should seek a new GI provider.  I reviewed the past medical history, family history, social history, surgical history, and allergies today and no changes were needed.  Please see the problem list section below in epic for further details.   Physical exam:   General: Well Developed, well nourished, and in no acute distress.  Neuro: Alert and oriented x3.  HEENT: Normocephalic, atraumatic.  Skin: Warm and dry. Cardiac: Regular rate and rhythm, no murmurs rubs or gallops, no lower extremity edema.  Respiratory: Clear to auscultation bilaterally. Not using accessory muscles, speaking in full sentences.  Impression and Recommendations:    1. Mixed hyperlipidemia Continue lovastatin 10 mg daily.  Checking lipid panel today.  2. Need for pneumococcal vaccination Pneumonia vaccine given in office today.  3. Need for influenza vaccination Flu vaccine given in office today.  4. Colon cancer screening Referring to GI for colonoscopy.  Return in about 6 months (around 02/11/2022) for HLD follow up. ___________________________________________ Clearnce Sorrel, DNP, APRN, FNP-BC [facility]

## 2021-08-15 LAB — LIPID PANEL
Cholesterol: 219 mg/dL — ABNORMAL HIGH (ref <200)
HDL: 65 mg/dL (ref >=50)
LDL Cholesterol (Calc): 119 mg/dL (calc) — ABNORMAL HIGH
Non-HDL Cholesterol (Calc): 154 mg/dL (calc) — ABNORMAL HIGH (ref <130)
Total CHOL/HDL Ratio: 3.4 (calc) (ref <5.0)
Triglycerides: 234 mg/dL — ABNORMAL HIGH (ref <150)

## 2021-08-18 ENCOUNTER — Encounter: Payer: Self-pay | Admitting: Medical-Surgical

## 2021-08-18 MED ORDER — LOVASTATIN 20 MG PO TABS: 20.0000 mg | ORAL_TABLET | Freq: Every day | ORAL | 3 refills | Status: DC

## 2021-08-20 DIAGNOSIS — M9902 Segmental and somatic dysfunction of thoracic region: Secondary | ICD-10-CM | POA: Diagnosis not present

## 2021-08-20 DIAGNOSIS — M4724 Other spondylosis with radiculopathy, thoracic region: Secondary | ICD-10-CM | POA: Diagnosis not present

## 2021-08-20 DIAGNOSIS — M47816 Spondylosis without myelopathy or radiculopathy, lumbar region: Secondary | ICD-10-CM | POA: Diagnosis not present

## 2021-08-20 DIAGNOSIS — M9903 Segmental and somatic dysfunction of lumbar region: Secondary | ICD-10-CM | POA: Diagnosis not present

## 2021-08-25 ENCOUNTER — Encounter: Payer: Self-pay | Admitting: Medical-Surgical

## 2021-08-25 DIAGNOSIS — Z1211 Encounter for screening for malignant neoplasm of colon: Secondary | ICD-10-CM

## 2021-08-25 DIAGNOSIS — Z86010 Personal history of colonic polyps: Secondary | ICD-10-CM

## 2021-09-22 DIAGNOSIS — M47816 Spondylosis without myelopathy or radiculopathy, lumbar region: Secondary | ICD-10-CM | POA: Diagnosis not present

## 2021-09-22 DIAGNOSIS — M9903 Segmental and somatic dysfunction of lumbar region: Secondary | ICD-10-CM | POA: Diagnosis not present

## 2021-09-22 DIAGNOSIS — M4724 Other spondylosis with radiculopathy, thoracic region: Secondary | ICD-10-CM | POA: Diagnosis not present

## 2021-09-22 DIAGNOSIS — M9902 Segmental and somatic dysfunction of thoracic region: Secondary | ICD-10-CM | POA: Diagnosis not present

## 2021-10-22 DIAGNOSIS — M9903 Segmental and somatic dysfunction of lumbar region: Secondary | ICD-10-CM | POA: Diagnosis not present

## 2021-10-22 DIAGNOSIS — M47816 Spondylosis without myelopathy or radiculopathy, lumbar region: Secondary | ICD-10-CM | POA: Diagnosis not present

## 2021-10-22 DIAGNOSIS — M4724 Other spondylosis with radiculopathy, thoracic region: Secondary | ICD-10-CM | POA: Diagnosis not present

## 2021-10-22 DIAGNOSIS — M9902 Segmental and somatic dysfunction of thoracic region: Secondary | ICD-10-CM | POA: Diagnosis not present

## 2021-11-13 ENCOUNTER — Other Ambulatory Visit: Payer: Self-pay | Admitting: Medical-Surgical

## 2021-11-26 DIAGNOSIS — M4724 Other spondylosis with radiculopathy, thoracic region: Secondary | ICD-10-CM | POA: Diagnosis not present

## 2021-11-26 DIAGNOSIS — M9902 Segmental and somatic dysfunction of thoracic region: Secondary | ICD-10-CM | POA: Diagnosis not present

## 2021-11-26 DIAGNOSIS — M47816 Spondylosis without myelopathy or radiculopathy, lumbar region: Secondary | ICD-10-CM | POA: Diagnosis not present

## 2021-11-26 DIAGNOSIS — M9903 Segmental and somatic dysfunction of lumbar region: Secondary | ICD-10-CM | POA: Diagnosis not present

## 2021-12-02 DIAGNOSIS — Z09 Encounter for follow-up examination after completed treatment for conditions other than malignant neoplasm: Secondary | ICD-10-CM | POA: Diagnosis not present

## 2021-12-02 DIAGNOSIS — D123 Benign neoplasm of transverse colon: Secondary | ICD-10-CM | POA: Diagnosis not present

## 2021-12-02 DIAGNOSIS — Z86010 Personal history of colonic polyps: Secondary | ICD-10-CM | POA: Diagnosis not present

## 2021-12-02 DIAGNOSIS — Z1211 Encounter for screening for malignant neoplasm of colon: Secondary | ICD-10-CM | POA: Diagnosis not present

## 2021-12-02 DIAGNOSIS — K5730 Diverticulosis of large intestine without perforation or abscess without bleeding: Secondary | ICD-10-CM | POA: Diagnosis not present

## 2021-12-02 DIAGNOSIS — D120 Benign neoplasm of cecum: Secondary | ICD-10-CM | POA: Diagnosis not present

## 2021-12-24 DIAGNOSIS — M4724 Other spondylosis with radiculopathy, thoracic region: Secondary | ICD-10-CM | POA: Diagnosis not present

## 2021-12-24 DIAGNOSIS — M9902 Segmental and somatic dysfunction of thoracic region: Secondary | ICD-10-CM | POA: Diagnosis not present

## 2021-12-24 DIAGNOSIS — M9903 Segmental and somatic dysfunction of lumbar region: Secondary | ICD-10-CM | POA: Diagnosis not present

## 2021-12-24 DIAGNOSIS — M47816 Spondylosis without myelopathy or radiculopathy, lumbar region: Secondary | ICD-10-CM | POA: Diagnosis not present

## 2022-01-12 IMAGING — MG DIGITAL SCREENING BREAST BILAT IMPLANT W/ TOMO W/ CAD
8 of 12 series · 8 of 28 positions shown · non-contrast
Comparison: Previous exam(s).

CLINICAL DATA: Screening.

EXAM:
DIGITAL SCREENING BILATERAL MAMMOGRAM WITH IMPLANTS, CAD AND
TOMOSYNTHESIS
TECHNIQUE: Bilateral screening digital craniocaudal and mediolateral oblique
mammograms were obtained. Bilateral screening digital breast
tomosynthesis was performed. The images were evaluated with
computer-aided detection. Standard and/or implant displaced views
were performed.

[L MLO]
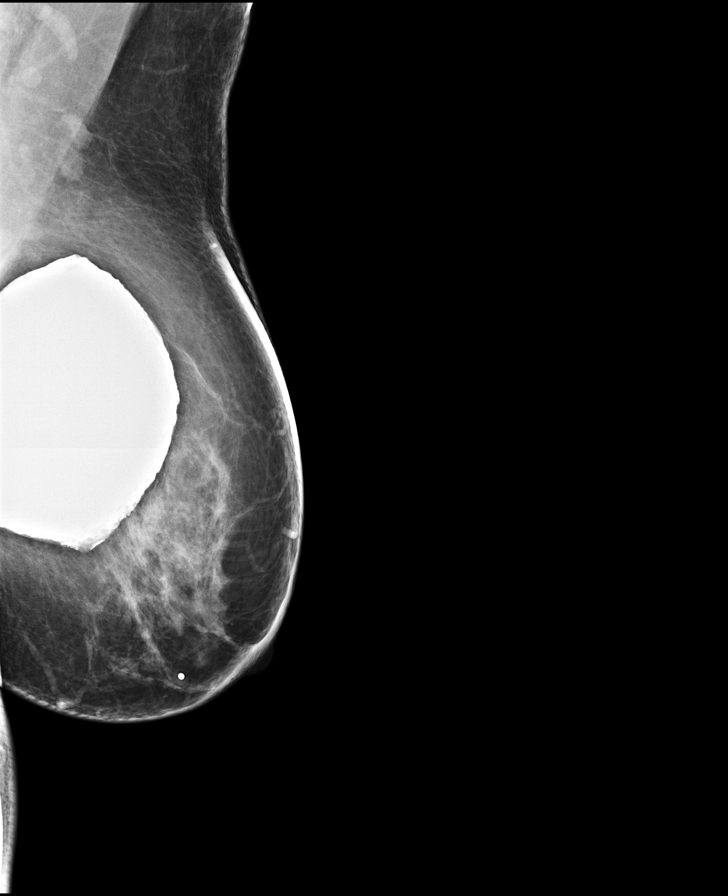

[R CC]
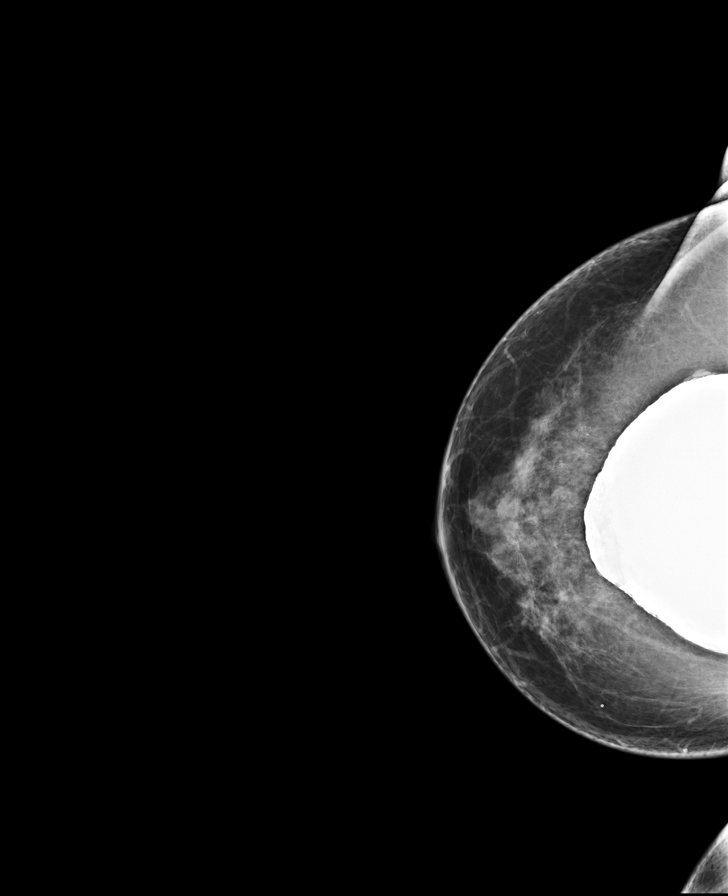

[L CC]
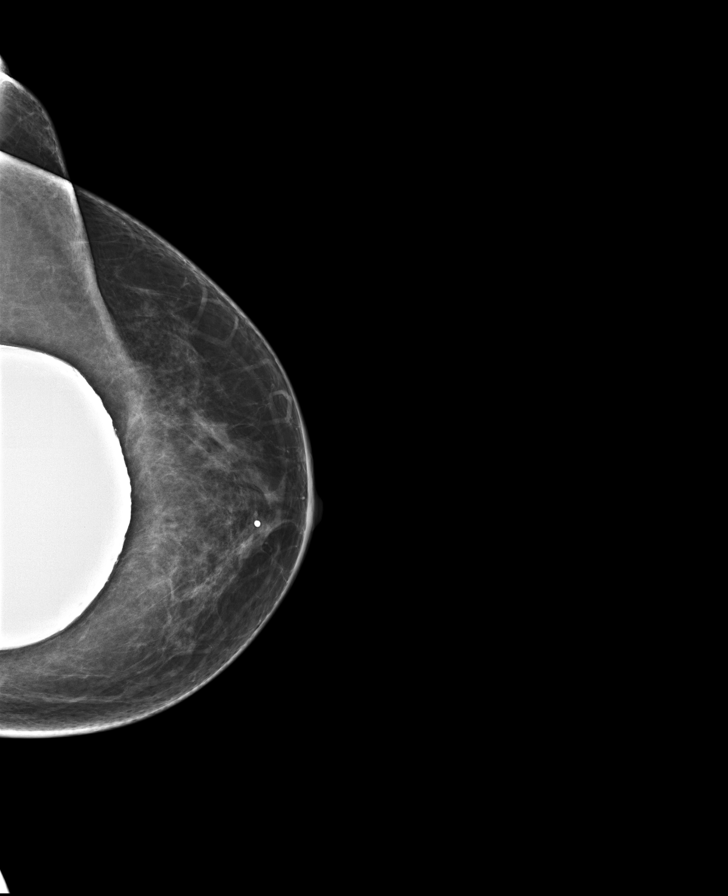

[R MLO]
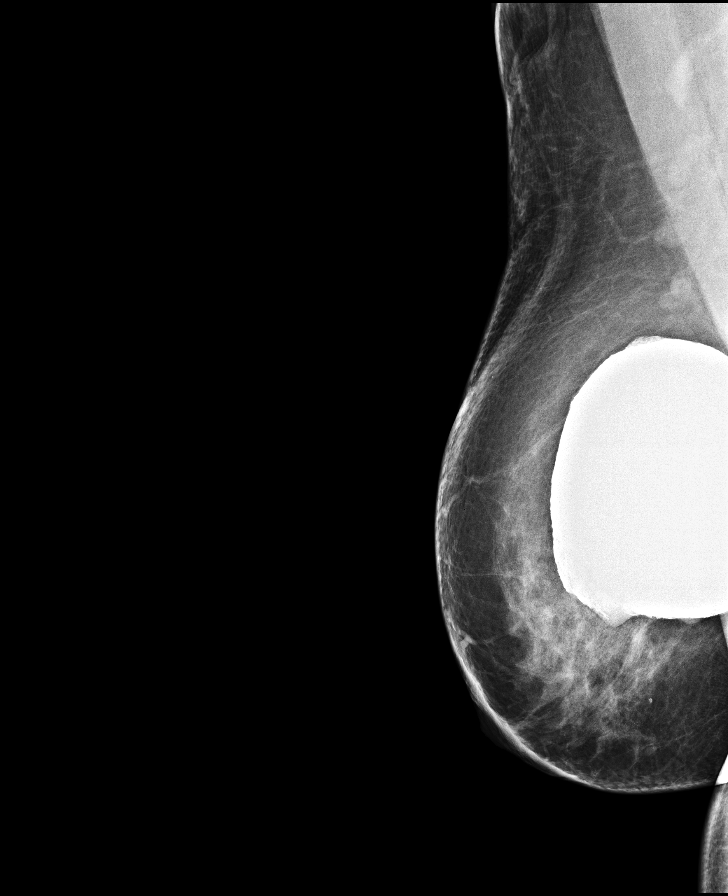

[R MLO synth-2D]
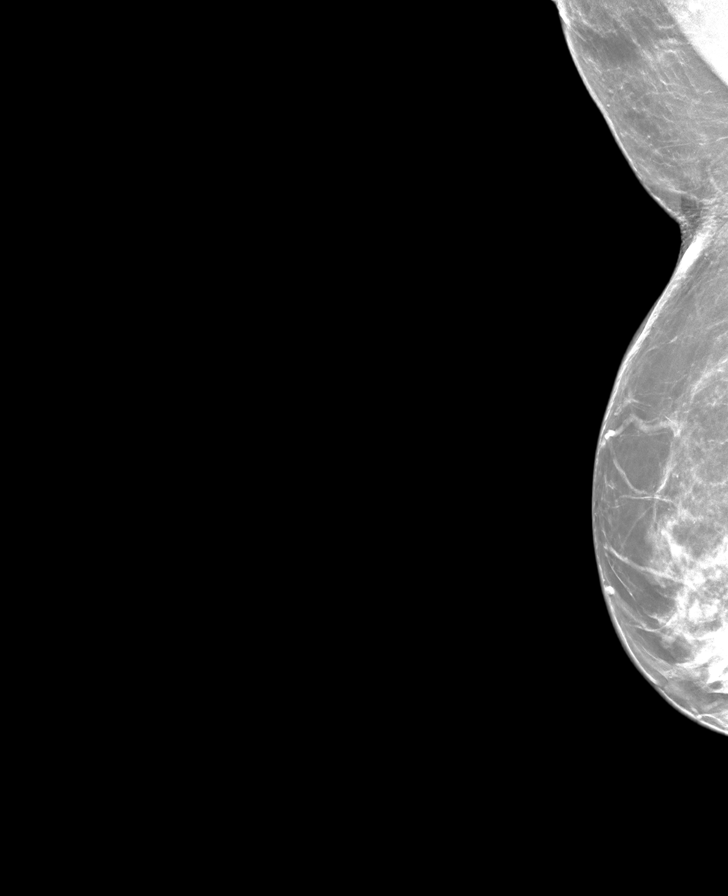

[L MLO synth-2D]
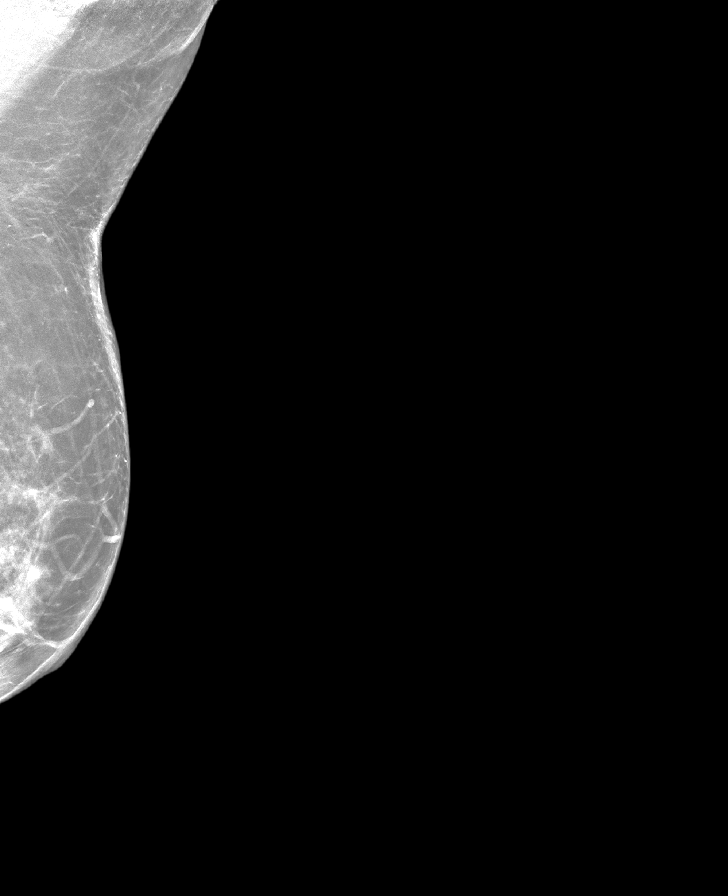

[L CC synth-2D]
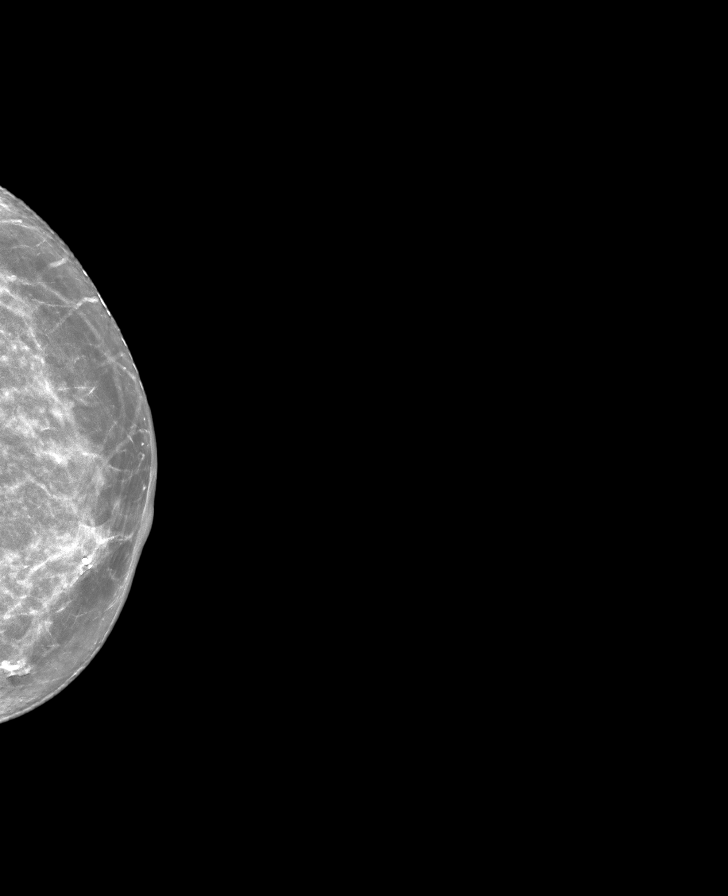

[R CC synth-2D]
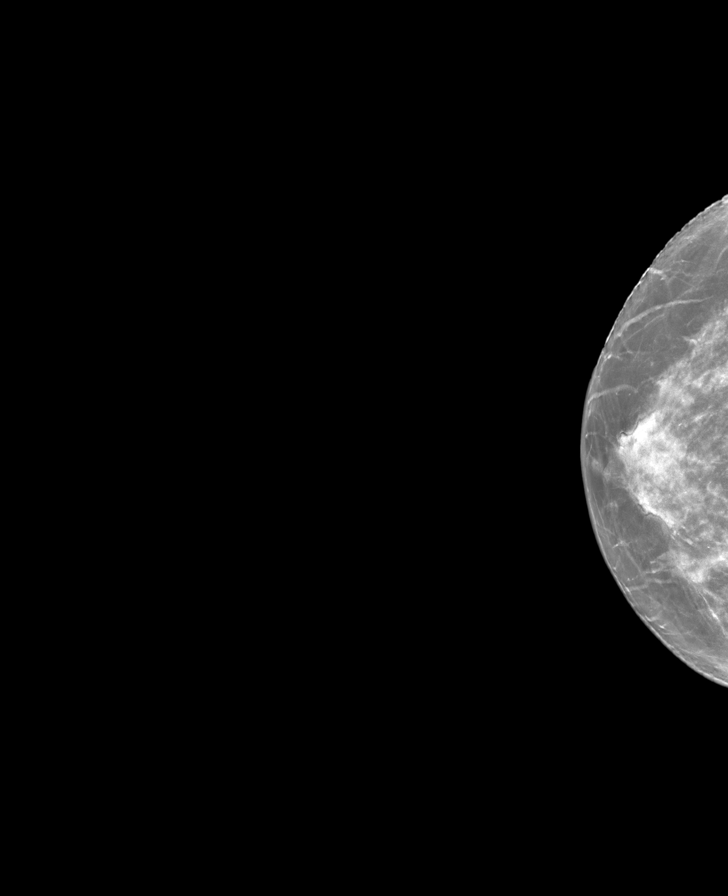

[8 of 28 positions shown; findings below may reference images not displayed]

ACR Breast Density Category c: The breast tissue is heterogeneously
dense, which may obscure small masses.
FINDINGS: The patient has retroglandular implants. There are no findings
suspicious for malignancy.
IMPRESSION: No mammographic evidence of malignancy. A result letter of this
screening mammogram will be mailed directly to the patient.

RECOMMENDATION:
Screening mammogram in one year. (Code:[id number])

BI-RADS CATEGORY  1:  Negative.

## 2022-01-27 DIAGNOSIS — M4724 Other spondylosis with radiculopathy, thoracic region: Secondary | ICD-10-CM | POA: Diagnosis not present

## 2022-01-27 DIAGNOSIS — M47816 Spondylosis without myelopathy or radiculopathy, lumbar region: Secondary | ICD-10-CM | POA: Diagnosis not present

## 2022-01-27 DIAGNOSIS — M9902 Segmental and somatic dysfunction of thoracic region: Secondary | ICD-10-CM | POA: Diagnosis not present

## 2022-01-27 DIAGNOSIS — M9903 Segmental and somatic dysfunction of lumbar region: Secondary | ICD-10-CM | POA: Diagnosis not present

## 2022-02-12 ENCOUNTER — Ambulatory Visit (INDEPENDENT_AMBULATORY_CARE_PROVIDER_SITE_OTHER): Payer: PPO | Admitting: Medical-Surgical

## 2022-02-12 ENCOUNTER — Other Ambulatory Visit: Payer: Self-pay

## 2022-02-12 ENCOUNTER — Encounter: Payer: Self-pay | Admitting: Medical-Surgical

## 2022-02-12 VITALS — BP 109/73 | HR 87 | Resp 20 | Ht 66.0 in | Wt 197.0 lb

## 2022-02-12 DIAGNOSIS — F5101 Primary insomnia: Secondary | ICD-10-CM | POA: Diagnosis not present

## 2022-02-12 DIAGNOSIS — Z0000 Encounter for general adult medical examination without abnormal findings: Secondary | ICD-10-CM

## 2022-02-12 DIAGNOSIS — E782 Mixed hyperlipidemia: Secondary | ICD-10-CM

## 2022-02-12 DIAGNOSIS — N898 Other specified noninflammatory disorders of vagina: Secondary | ICD-10-CM | POA: Diagnosis not present

## 2022-02-12 DIAGNOSIS — B009 Herpesviral infection, unspecified: Secondary | ICD-10-CM

## 2022-02-12 DIAGNOSIS — A6000 Herpesviral infection of urogenital system, unspecified: Secondary | ICD-10-CM | POA: Diagnosis not present

## 2022-02-12 DIAGNOSIS — Z1329 Encounter for screening for other suspected endocrine disorder: Secondary | ICD-10-CM | POA: Diagnosis not present

## 2022-02-12 MED ORDER — TRAZODONE HCL 50 MG PO TABS: 25.0000 mg | ORAL_TABLET | Freq: Every evening | ORAL | 3 refills | Status: DC | PRN

## 2022-02-12 NOTE — Progress Notes (Signed)
?  HPI with pertinent ROS:  ? ?CC: 16-month follow-up ? ?HPI: ?Pleasant 66 year old female presenting today for the following: ? ?Insomnia-taking Lunesta 2 mg nightly as needed.  This is required about 2-3 nights per week.  Tolerating well without side effects.  On the nights she does not take Lunesta, she takes melatonin which does help a little.  Has difficulty some nights with waking and being awake for hours.  Those are the night she usually takes the [place], especially when she has to be up early.  Unfortunately, Johnnye Sima appears to be a tier 4 drug on her new insurance and she is looking for something that will be a little cheaper. ? ?HLD-taking lovastatin 20 mg daily, tolerating well without side effects.  Following a low-fat diet.  Due for labs. ? ?Has difficulty with intermittent vaginal itching.  This has been going on for several years and her previous provider was not concerned about it.  She has found a cream that she gets off [place] with the active ingredient of menthol that she is using externally.  Wants to make sure this is okay. ? ?Genital herpes-no recent outbreaks.  Uses Valtrex as needed. ? ?I reviewed the past medical history, family history, social history, surgical history, and allergies today and no changes were needed.  Please see the problem list section below in epic for further details. ? ? ?Physical exam:  ? ?General: Well Developed, well nourished, and in no acute distress.  ?Neuro: Alert and oriented x3.  ?HEENT: Normocephalic, atraumatic.  ?Skin: Warm and dry. ?Cardiac: Regular rate and rhythm, no murmurs rubs or gallops, no lower extremity edema.  ?Respiratory: Clear to auscultation bilaterally. Not using accessory muscles, speaking in full sentences. ? ?Impression and Recommendations:   ? ?1. Mixed hyperlipidemia ?Checking lipid panel today.  Continue lovastatin 20 mg daily, plan to titrate pending results if needed.  Continue low-fat diet. ?- Lipid Panel w/reflex Direct LDL ? ?2.  Primary insomnia ?Discussed various options.  Continue melatonin as needed.  Sending in trazodone 25-50 mg daily as this is far more affordable and usually well-tolerated.  Advised to start at the lower dose to make sure there is no intolerance or concerning side effects. ? ?3. Genital herpes simplex, unspecified site ?4. Herpes simplex ?Continue Valtrex as needed. ? ?5. Vaginal itching ?Reviewed cream on hand for active ingredients.  This is okay for external use so okay to continue this.  If this becomes ineffective, we can discuss use of Estrace or Premarin. ? ?6. Preventative health care ?Checking CBC and CMP. ?- COMPLETE METABOLIC PANEL WITH GFR ?- CBC with Differential/Platelet ? ?7. Thyroid disorder screen ?Checking TSH.  ?- TSH ? ?Return in about 6 months (around 08/15/2022) for chronic disease follow up. ?___________________________________________ ?Clearnce Sorrel, DNP, APRN, FNP-BC ?[facility] ?[place] ?

## 2022-02-13 LAB — CBC WITH DIFFERENTIAL/PLATELET
Absolute Monocytes: 905 cells/uL (ref 200–950)
Basophils Absolute: 65 cells/uL (ref 0–200)
Basophils Relative: 0.6 %
Eosinophils Absolute: 1395 cells/uL — ABNORMAL HIGH (ref 15–500)
Eosinophils Relative: 12.8 %
HCT: 48 % — ABNORMAL HIGH (ref 35.0–45.0)
Hemoglobin: 15.9 g/dL — ABNORMAL HIGH (ref 11.7–15.5)
Lymphs Abs: 2932 cells/uL (ref 850–3900)
MCH: 30.1 pg (ref 27.0–33.0)
MCHC: 33.1 g/dL (ref 32.0–36.0)
MCV: 90.9 fL (ref 80.0–100.0)
MPV: 12.1 fL (ref 7.5–12.5)
Monocytes Relative: 8.3 %
Neutro Abs: 5603 cells/uL (ref 1500–7800)
Neutrophils Relative %: 51.4 %
Platelets: 358 10*3/uL (ref 140–400)
RBC: 5.28 10*6/uL — ABNORMAL HIGH (ref 3.80–5.10)
RDW: 12.3 % (ref 11.0–15.0)
Total Lymphocyte: 26.9 %
WBC: 10.9 10*3/uL — ABNORMAL HIGH (ref 3.8–10.8)

## 2022-02-13 LAB — COMPLETE METABOLIC PANEL WITH GFR
AG Ratio: 1.6 (calc) (ref 1.0–2.5)
ALT: 19 U/L (ref 6–29)
AST: 27 U/L (ref 10–35)
Albumin: 4.4 g/dL (ref 3.6–5.1)
Alkaline phosphatase (APISO): 62 U/L (ref 37–153)
BUN: 9 mg/dL (ref 7–25)
CO2: 29 mmol/L (ref 20–32)
Calcium: 10.9 mg/dL — ABNORMAL HIGH (ref 8.6–10.4)
Chloride: 103 mmol/L (ref 98–110)
Creat: 0.99 mg/dL (ref 0.50–1.05)
Globulin: 2.8 g/dL (calc) (ref 1.9–3.7)
Glucose, Bld: 83 mg/dL (ref 65–99)
Potassium: 4.9 mmol/L (ref 3.5–5.3)
Sodium: 141 mmol/L (ref 135–146)
Total Bilirubin: 0.5 mg/dL (ref 0.2–1.2)
Total Protein: 7.2 g/dL (ref 6.1–8.1)
eGFR: 62 mL/min/{1.73_m2} (ref >=60)

## 2022-02-13 LAB — LIPID PANEL W/REFLEX DIRECT LDL
Cholesterol: 207 mg/dL — ABNORMAL HIGH (ref <200)
HDL: 73 mg/dL (ref >=50)
LDL Cholesterol (Calc): 102 mg/dL (calc) — ABNORMAL HIGH
Non-HDL Cholesterol (Calc): 134 mg/dL (calc) — ABNORMAL HIGH (ref <130)
Total CHOL/HDL Ratio: 2.8 (calc) (ref <5.0)
Triglycerides: 205 mg/dL — ABNORMAL HIGH (ref <150)

## 2022-02-13 LAB — TSH: TSH: 1.15 mIU/L (ref 0.40–4.50)

## 2022-03-01 DIAGNOSIS — M47816 Spondylosis without myelopathy or radiculopathy, lumbar region: Secondary | ICD-10-CM | POA: Diagnosis not present

## 2022-03-01 DIAGNOSIS — M4724 Other spondylosis with radiculopathy, thoracic region: Secondary | ICD-10-CM | POA: Diagnosis not present

## 2022-03-01 DIAGNOSIS — M9902 Segmental and somatic dysfunction of thoracic region: Secondary | ICD-10-CM | POA: Diagnosis not present

## 2022-03-01 DIAGNOSIS — M9903 Segmental and somatic dysfunction of lumbar region: Secondary | ICD-10-CM | POA: Diagnosis not present

## 2022-03-15 ENCOUNTER — Ambulatory Visit (INDEPENDENT_AMBULATORY_CARE_PROVIDER_SITE_OTHER): Payer: PPO | Admitting: Medical-Surgical

## 2022-03-15 DIAGNOSIS — Z0000 Encounter for general adult medical examination without abnormal findings: Secondary | ICD-10-CM | POA: Diagnosis not present

## 2022-03-15 NOTE — Patient Instructions (Addendum)
?MEDICARE ANNUAL WELLNESS VISIT ?Health Maintenance Summary and Written Plan of Care ? ?Ms. Michelle Mathews , ? ?Thank you for allowing me to perform your Medicare Annual Wellness Visit and for your ongoing commitment to your health.  ? ?Health Maintenance & Immunization History ?Health Maintenance  ?Topic Date Due  ?? COVID-19 Vaccine (5 - Booster for Moderna series) 03/31/2022 (Originally 06/18/2021)  ?? INFLUENZA VACCINE  07/13/2022  ?? MAMMOGRAM  08/07/2023  ?? TETANUS/TDAP  09/18/2024  ?? COLONOSCOPY (Pts 45-34yr Insurance coverage will need to be confirmed)  11/25/2026  ?? Pneumonia Vaccine 68 Years old  Completed  ?? DEXA SCAN  Completed  ?? Hepatitis C Screening  Completed  ?? Zoster Vaccines- Shingrix  Completed  ?? HPV VACCINES  Aged Out  ? ?Immunization History  ?Administered Date(s) Administered  ?? Fluad Quad(high Dose 65+) 08/14/2021  ?? Influenza-Unspecified 09/08/2015, 09/13/2016, 09/20/2017, 09/26/2020  ?? Moderna Sars-Covid-2 Vaccination 01/26/2020, 02/24/2020, 09/26/2020, 04/23/2021  ?? PNEUMOCOCCAL CONJUGATE-20 08/14/2021  ?? Pneumococcal Polysaccharide-23 09/10/2019  ?? Tdap 09/18/2014  ?? Zoster Recombinat (Shingrix) 09/10/2019, 11/09/2019  ?? Zoster, Live 09/10/2019, 11/09/2019  ?? Zoster, Unspecified 09/10/2019, 11/09/2019  ? ? ?These are the patient goals that we discussed: ? Goals Addressed   ?  ?  ?  ?  ?  ? This Visit's Progress  ? ?  Patient Stated (pt-stated)     ?   Would like to loose 20 lbs. ?  ?  ?  ? ?This is a list of Health Maintenance Items that are overdue or due now: ?Bone densitometry screening - Due after June, 2023 ? ?Orders/Referrals Placed Today: ?No orders of the defined types were placed in this encounter. ? ?(Contact our referral department at [phone] you have not spoken with someone about your referral appointment within the next 5 days)  ? ? ?Follow-up Plan ?Follow-up with JSamuel Bouche NP as planned ?Medicare wellness visit in one year.  ?Bone density can be done  after June, 2023.  ?Patient will access AVS on my chart. ? ? ? ?  ?Health Maintenance, Female ?Adopting a healthy lifestyle and getting preventive care are important in promoting health and wellness. Ask your health care provider about: ?The right schedule for you to have regular tests and exams. ?Things you can do on your own to prevent diseases and keep yourself healthy. ?What should I know about diet, weight, and exercise? ?Eat a healthy diet ? ?Eat a diet that includes plenty of vegetables, fruits, low-fat dairy products, and lean protein. ?Do not eat a lot of foods that are high in solid fats, added sugars, or sodium. ?Maintain a healthy weight ?Body mass index (BMI) is used to identify weight problems. It estimates body fat based on height and weight. Your health care provider can help determine your BMI and help you achieve or maintain a healthy weight. ?Get regular exercise ?Get regular exercise. This is one of the most important things you can do for your health. Most adults should: ?Exercise for at least 150 minutes each week. The exercise should increase your heart rate and make you sweat (moderate-intensity exercise). ?Do strengthening exercises at least twice a week. This is in addition to the moderate-intensity exercise. ?Spend less time sitting. Even light physical activity can be beneficial. ?Watch cholesterol and blood lipids ?Have your blood tested for lipids and cholesterol at 68years of age, then have this test every 5 years. ?Have your cholesterol levels checked more often if: ?Your lipid or cholesterol levels are high. ?You are  older than 68 years of age. ?You are at high risk for heart disease. ?What should I know about cancer screening? ?Depending on your health history and family history, you may need to have cancer screening at various ages. This may include screening for: ?Breast cancer. ?Cervical cancer. ?Colorectal cancer. ?Skin cancer. ?Lung cancer. ?What should I know about heart  disease, diabetes, and high blood pressure? ?Blood pressure and heart disease ?High blood pressure causes heart disease and increases the risk of stroke. This is more likely to develop in people who have high blood pressure readings or are overweight. ?Have your blood pressure checked: ?Every 3-5 years if you are 31-11 years of age. ?Every year if you are 60 years old or older. ?Diabetes ?Have regular diabetes screenings. This checks your fasting blood sugar level. Have the screening done: ?Once every three years after age 31 if you are at a normal weight and have a low risk for diabetes. ?More often and at a younger age if you are overweight or have a high risk for diabetes. ?What should I know about preventing infection? ?Hepatitis B ?If you have a higher risk for hepatitis B, you should be screened for this virus. Talk with your health care provider to find out if you are at risk for hepatitis B infection. ?Hepatitis C ?Testing is recommended for: ?Everyone born from [birth year] through 1967. ?Anyone with known risk factors for hepatitis C. ?Sexually transmitted infections (STIs) ?Get screened for STIs, including gonorrhea and chlamydia, if: ?You are sexually active and are younger than 67 years of age. ?You are older than 68 years of age and your health care provider tells you that you are at risk for this type of infection. ?Your sexual activity has changed since you were last screened, and you are at increased risk for chlamydia or gonorrhea. Ask your health care provider if you are at risk. ?Ask your health care provider about whether you are at high risk for HIV. Your health care provider may recommend a prescription medicine to help prevent HIV infection. If you choose to take medicine to prevent HIV, you should first get tested for HIV. You should then be tested every 3 months for as long as you are taking the medicine. ?Pregnancy ?If you are about to stop having your period (premenopausal) and you may become  pregnant, seek counseling before you get pregnant. ?Take 400 to 800 micrograms (mcg) of folic acid every day if you become pregnant. ?Ask for birth control (contraception) if you want to prevent pregnancy. ?Osteoporosis and menopause ?Osteoporosis is a disease in which the bones lose minerals and strength with aging. This can result in bone fractures. If you are 3 years old or older, or if you are at risk for osteoporosis and fractures, ask your health care provider if you should: ?Be screened for bone loss. ?Take a calcium or vitamin D supplement to lower your risk of fractures. ?Be given hormone replacement therapy (HRT) to treat symptoms of menopause. ?Follow these instructions at home: ?Alcohol use ?Do not drink alcohol if: ?Your health care provider tells you not to drink. ?You are pregnant, may be pregnant, or are planning to become pregnant. ?If you drink alcohol: ?Limit how much you have to: ?0-1 drink a day. ?Know how much alcohol is in your drink. In the [place] one drink equals one 12 oz bottle of beer (355 mL), one 5 oz glass of wine (148 mL), or one 1? oz glass of hard liquor (44 mL). ?Lifestyle ?  Do not use any products that contain nicotine or tobacco. These products include cigarettes, chewing tobacco, and vaping devices, such as e-cigarettes. If you need help quitting, ask your health care provider. ?Do not use street drugs. ?Do not share needles. ?Ask your health care provider for help if you need support or information about quitting drugs. ?General instructions ?Schedule regular health, dental, and eye exams. ?Stay current with your vaccines. ?Tell your health care provider if: ?You often feel depressed. ?You have ever been abused or do not feel safe at home. ?Summary ?Adopting a healthy lifestyle and getting preventive care are important in promoting health and wellness. ?Follow your health care provider's instructions about healthy diet, exercising, and getting tested or screened for  diseases. ?Follow your health care provider's instructions on monitoring your cholesterol and blood pressure. ?This information is not intended to replace advice given to you by your health care provider. Make sure you disc

## 2022-03-15 NOTE — Progress Notes (Signed)
? ? ?MEDICARE ANNUAL WELLNESS VISIT ? ?03/15/2022 ? ?Telephone Visit Disclaimer ?This Medicare AWV was conducted by telephone due to national recommendations for restrictions regarding the COVID-19 Pandemic (e.g. social distancing).  I verified, using two identifiers, that I am speaking with Michelle Mathews or their authorized healthcare agent. I discussed the limitations, risks, security, and privacy concerns of performing an evaluation and management service by telephone and the potential availability of an in-person appointment in the future. The patient expressed understanding and agreed to proceed.  ?Location of Patient: Home ?Location of Provider (nurse):  Provider home ? ?Subjective:  ? ? ?Michelle Mathews is a 66 y.o. female patient of Samuel Bouche, NP who had a Medicare Annual Wellness Visit today via telephone. [place] is Retired and lives alone. she have any children. she reports that she is socially active and does interact with friends/family regularly. she is moderately physically active and enjoys sewing and playing pickleball. ? ?Patient Care Team: ?Samuel Bouche, NP as PCP - General (Nurse Practitioner) ? ? ?  03/15/2022  ? 10:22 AM 03/09/2021  ?  9:58 AM 12/09/2020  ?  2:12 PM  ?Advanced Directives  ?Does Patient Have a Medical Advance Directive? Yes Yes Yes  ?Type of Advance Directive Living will;Healthcare Power of [place];Living will [place];Living will  ?Does patient want to make changes to medical advance directive? No - Patient declined No - Patient declined No - Patient declined  ?Copy of [place] in Chart? No - copy requested No - copy requested No - copy requested  ? ? ?Hospital Utilization Over the Past 12 Months: ?# of hospitalizations or ER visits: 0 ?# of surgeries: 0 ? ?Review of Systems    ?Patient reports that her overall health is better compared to last year. ? ?History obtained from chart review and the patient ? ?Patient  Reported Readings (BP, Pulse, CBG, Weight, etc) ?none ? ?Pain Assessment ?Pain : No/denies pain ? ?  ? ?Current Medications & Allergies (verified) ?Allergies as of 03/15/2022   ? ?   Reactions  ? Bee Venom Swelling  ? Latex Rash  ? Sunflower Oil Other (See Comments)  ? Tickling feeling in mouth  ? Wasp Venom Protein Swelling  ? ?  ? ?  ?Medication List  ?  ? ?  ? Accurate as of March 15, 2022 10:45 AM. If you have any questions, ask your nurse or doctor.  ?  ?  ? ?  ? ?CALCIUM PO ?Take 1 tablet by mouth daily. ?  ?eszopiclone 2 MG Tabs tablet ?Commonly known as: LUNESTA ?Take 2 mg by mouth daily as needed. ?  ?loratadine 10 MG tablet ?Commonly known as: CLARITIN ?Take 1 tablet by mouth daily. ?  ?lovastatin 20 MG tablet ?Commonly known as: MEVACOR ?Take 1 tablet (20 mg total) by mouth at bedtime. ?  ?[place] 40-5-3.3 MG Tabs ?Generic drug: Collagen-Boron-Hyaluronic Acid ?Take 1 tablet by mouth daily. ?  ?MULTIPLE VITAMIN PO ?Take 1 tablet by mouth daily. ?  ?traZODone 50 MG tablet ?Commonly known as: DESYREL ?Take 0.5-1 tablets (25-50 mg total) by mouth at bedtime as needed for sleep. ?  ?UNABLE TO FIND ?Med Name: Dr. Doylene Canard sleep aid. She takes one capsule every night. ?  ?valACYclovir 1000 MG tablet ?Commonly known as: VALTREX ?Take 1 tablet by mouth daily as needed. ?  ? ?  ? ? ?History (reviewed): ?Past Medical History:  ?Diagnosis Date  ?  Allergy Age 7  ? Anxiety   ? Arthritis 2010  ? Herpes simplex   ? Hyperlipidemia   ? Insomnia   ? Morton's neuroma of both feet   ? ?Past Surgical History:  ?Procedure Laterality Date  ? AUGMENTATION MAMMAPLASTY Bilateral 1988  ? Silicone  ? COLONOSCOPY    ? [place], 1984  ? Mammoplasty, rhinoplasty  ? EXCISION MORTON'S NEUROMA Left   ? ?Family History  ?Problem Relation Age of Onset  ? Alcohol abuse Mother   ? Drug abuse Brother   ? Early death Brother   ? Cancer Neg Hx   ? Hyperlipidemia Neg Hx   ? Hypertension Neg Hx   ? Anxiety disorder  Neg Hx   ? Depression Neg Hx   ? Diabetes Neg Hx   ? Heart disease Neg Hx   ? Kidney disease Neg Hx   ? ?Social History  ? ?Socioeconomic History  ? Marital status: Divorced  ?  Spouse name: Not on file  ? Number of children: 0  ? Years of education: 12  ? Highest education level: Bachelor's degree (e.g., BA, AB, BS)  ?Occupational History  ?  Comment: Retired  ?Tobacco Use  ? Smoking status: Never  ? Smokeless tobacco: Never  ?Vaping Use  ? Vaping Use: Never used  ?Substance and Sexual Activity  ? Alcohol use: Yes  ?  Alcohol/week: 1.0 - 2.0 standard drink  ?  Types: 1 - 2 Standard drinks or equivalent per week  ?  Comment: week  ? Drug use: Never  ? Sexual activity: Not Currently  ?  Partners: Male  ?  Birth control/protection: Post-menopausal  ?Other Topics Concern  ? Not on file  ?Social History Narrative  ? Lives alone. 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 She has a little dog that she walks twice daily.   ? ?Social Determinants of Health  ? ?Financial Resource Strain: Low Risk   ? Difficulty of Paying Living Expenses: Not hard at all  ?Food Insecurity: No Food Insecurity  ? Worried About Charity fundraiser in the Last Year: Never true  ? Ran Out of Food in the Last Year: Never true  ?Transportation Needs: No Transportation Needs  ? Lack of Transportation (Medical): No  ? Lack of Transportation (Non-Medical): No  ?Physical Activity: Sufficiently Active  ? Days of Exercise per Week: 3 days  ? Minutes of Exercise per Session: 70 min  ?Stress: No Stress Concern Present  ? Feeling of Stress : Not at all  ?Social Connections: Moderately Isolated  ? Frequency of Communication with Friends and Family: Once a week  ? Frequency of Social Gatherings with Friends and Family: Three times a week  ? Attends Religious Services: Never  ? Active Member of Clubs or Organizations: Yes  ? Attends Archivist Meetings: More than 4 times per year  ? Marital Status: Divorced  ? ? ?Activities of Daily  Living ? ?  03/14/2022  ? 12:13 PM  ?In your present state of health, do you have any difficulty performing the following activities:  ?Hearing? 0  ?Vision? 0  ?Difficulty concentrating or making decisions? 0  ?Walking or climbing stairs? 0  ?Dressing or bathing? 0  ?Doing errands, shopping? 0  ?Preparing Food and eating ? N  ?Using the Toilet? N  ?In the past six months, have you accidently leaked urine? N  ?Do you have problems with loss of bowel control? N  ?  Managing your Medications? N  ?Managing your Finances? N  ?Housekeeping or managing your Housekeeping? N  ? ? ?Patient Education/ Literacy ?How often do you need to have someone help you when you read instructions, pamphlets, or other written materials from your doctor or pharmacy?: 1 - Never ?What is the last grade level you completed in school?: Bachelor's degree ? ?Exercise ?Current Exercise Habits: Home exercise routine, Type of exercise: Other - see comments (pickleball), Time (Minutes): > 60, Frequency (Times/Week): 7, Weekly Exercise (Minutes/Week): 0, Intensity: Moderate, Exercise limited by: None identified ? ?Diet ?Patient reports consuming 2 meals a day and 2 snack(s) a day ?Patient reports that her primary diet is: Regular ?Patient reports that she does have regular access to food.  ? ?Depression Screen ? ?  03/15/2022  ? 10:20 AM 02/12/2022  ? 10:33 AM 03/09/2021  ?  9:59 AM 12/09/2020  ?  2:13 PM  ?PHQ 2/9 Scores  ?PHQ - 2 Score 0 0 0 0  ?PHQ- 9 Score    2  ?  ? ?Fall Risk ? ?  03/14/2022  ? 12:13 PM 02/12/2022  ? 10:33 AM 03/09/2021  ?  9:59 AM 12/09/2020  ?  2:13 PM  ?Fall Risk   ?Falls in the past year? 0 0 1 0  ?Number falls in past yr: 0 0 0 0  ?Injury with Fall? 0 0 0 0  ?Risk for fall due to : No Fall Risks No Fall Risks No Fall Risks   ?Follow up Falls evaluation completed Falls evaluation completed Falls evaluation completed Falls evaluation completed  ? ?  ?Objective:  ?Rocky Rishel seemed alert and oriented and she participated appropriately  during our telephone visit. ? ?Blood Pressure Weight BMI  ?BP Readings from Last 3 Encounters:  ?02/12/22 109/73  ?08/14/21 112/68  ?12/09/20 123/76  ? Wt Readings from Last 3 Encounters:  ?02/12/22 197 lb (89.4 kg)  ?0

## 2022-04-05 DIAGNOSIS — M47816 Spondylosis without myelopathy or radiculopathy, lumbar region: Secondary | ICD-10-CM | POA: Diagnosis not present

## 2022-04-05 DIAGNOSIS — M9902 Segmental and somatic dysfunction of thoracic region: Secondary | ICD-10-CM | POA: Diagnosis not present

## 2022-04-05 DIAGNOSIS — M9903 Segmental and somatic dysfunction of lumbar region: Secondary | ICD-10-CM | POA: Diagnosis not present

## 2022-04-05 DIAGNOSIS — M4724 Other spondylosis with radiculopathy, thoracic region: Secondary | ICD-10-CM | POA: Diagnosis not present

## 2022-04-21 ENCOUNTER — Other Ambulatory Visit: Payer: Self-pay

## 2022-04-21 MED ORDER — LOVASTATIN 20 MG PO TABS
20.0000 mg | ORAL_TABLET | Freq: Every day | ORAL | 1 refills | Status: DC
Start: 2022-04-21 — End: 2022-10-18

## 2022-05-12 DIAGNOSIS — M47816 Spondylosis without myelopathy or radiculopathy, lumbar region: Secondary | ICD-10-CM | POA: Diagnosis not present

## 2022-05-12 DIAGNOSIS — M9902 Segmental and somatic dysfunction of thoracic region: Secondary | ICD-10-CM | POA: Diagnosis not present

## 2022-05-12 DIAGNOSIS — M9903 Segmental and somatic dysfunction of lumbar region: Secondary | ICD-10-CM | POA: Diagnosis not present

## 2022-05-12 DIAGNOSIS — M4724 Other spondylosis with radiculopathy, thoracic region: Secondary | ICD-10-CM | POA: Diagnosis not present

## 2022-06-10 DIAGNOSIS — M9903 Segmental and somatic dysfunction of lumbar region: Secondary | ICD-10-CM | POA: Diagnosis not present

## 2022-06-10 DIAGNOSIS — M9902 Segmental and somatic dysfunction of thoracic region: Secondary | ICD-10-CM | POA: Diagnosis not present

## 2022-06-10 DIAGNOSIS — M4724 Other spondylosis with radiculopathy, thoracic region: Secondary | ICD-10-CM | POA: Diagnosis not present

## 2022-06-10 DIAGNOSIS — M47816 Spondylosis without myelopathy or radiculopathy, lumbar region: Secondary | ICD-10-CM | POA: Diagnosis not present

## 2022-06-21 DIAGNOSIS — M9902 Segmental and somatic dysfunction of thoracic region: Secondary | ICD-10-CM | POA: Diagnosis not present

## 2022-06-21 DIAGNOSIS — M4724 Other spondylosis with radiculopathy, thoracic region: Secondary | ICD-10-CM | POA: Diagnosis not present

## 2022-06-21 DIAGNOSIS — M9903 Segmental and somatic dysfunction of lumbar region: Secondary | ICD-10-CM | POA: Diagnosis not present

## 2022-06-21 DIAGNOSIS — M47816 Spondylosis without myelopathy or radiculopathy, lumbar region: Secondary | ICD-10-CM | POA: Diagnosis not present

## 2022-06-22 DIAGNOSIS — M47816 Spondylosis without myelopathy or radiculopathy, lumbar region: Secondary | ICD-10-CM | POA: Diagnosis not present

## 2022-06-22 DIAGNOSIS — M4724 Other spondylosis with radiculopathy, thoracic region: Secondary | ICD-10-CM | POA: Diagnosis not present

## 2022-06-22 DIAGNOSIS — M9903 Segmental and somatic dysfunction of lumbar region: Secondary | ICD-10-CM | POA: Diagnosis not present

## 2022-06-22 DIAGNOSIS — M9902 Segmental and somatic dysfunction of thoracic region: Secondary | ICD-10-CM | POA: Diagnosis not present

## 2022-06-24 DIAGNOSIS — M47816 Spondylosis without myelopathy or radiculopathy, lumbar region: Secondary | ICD-10-CM | POA: Diagnosis not present

## 2022-06-24 DIAGNOSIS — M4724 Other spondylosis with radiculopathy, thoracic region: Secondary | ICD-10-CM | POA: Diagnosis not present

## 2022-06-24 DIAGNOSIS — M9903 Segmental and somatic dysfunction of lumbar region: Secondary | ICD-10-CM | POA: Diagnosis not present

## 2022-06-24 DIAGNOSIS — M9902 Segmental and somatic dysfunction of thoracic region: Secondary | ICD-10-CM | POA: Diagnosis not present

## 2022-06-28 DIAGNOSIS — M9903 Segmental and somatic dysfunction of lumbar region: Secondary | ICD-10-CM | POA: Diagnosis not present

## 2022-06-28 DIAGNOSIS — M47816 Spondylosis without myelopathy or radiculopathy, lumbar region: Secondary | ICD-10-CM | POA: Diagnosis not present

## 2022-06-28 DIAGNOSIS — M4724 Other spondylosis with radiculopathy, thoracic region: Secondary | ICD-10-CM | POA: Diagnosis not present

## 2022-06-28 DIAGNOSIS — M9902 Segmental and somatic dysfunction of thoracic region: Secondary | ICD-10-CM | POA: Diagnosis not present

## 2022-07-01 DIAGNOSIS — M9903 Segmental and somatic dysfunction of lumbar region: Secondary | ICD-10-CM | POA: Diagnosis not present

## 2022-07-01 DIAGNOSIS — M47816 Spondylosis without myelopathy or radiculopathy, lumbar region: Secondary | ICD-10-CM | POA: Diagnosis not present

## 2022-07-01 DIAGNOSIS — M9902 Segmental and somatic dysfunction of thoracic region: Secondary | ICD-10-CM | POA: Diagnosis not present

## 2022-07-01 DIAGNOSIS — M4724 Other spondylosis with radiculopathy, thoracic region: Secondary | ICD-10-CM | POA: Diagnosis not present

## 2022-07-08 DIAGNOSIS — M9903 Segmental and somatic dysfunction of lumbar region: Secondary | ICD-10-CM | POA: Diagnosis not present

## 2022-07-08 DIAGNOSIS — M9902 Segmental and somatic dysfunction of thoracic region: Secondary | ICD-10-CM | POA: Diagnosis not present

## 2022-07-08 DIAGNOSIS — M47816 Spondylosis without myelopathy or radiculopathy, lumbar region: Secondary | ICD-10-CM | POA: Diagnosis not present

## 2022-07-08 DIAGNOSIS — M4724 Other spondylosis with radiculopathy, thoracic region: Secondary | ICD-10-CM | POA: Diagnosis not present

## 2022-07-22 DIAGNOSIS — M9903 Segmental and somatic dysfunction of lumbar region: Secondary | ICD-10-CM | POA: Diagnosis not present

## 2022-07-22 DIAGNOSIS — M4724 Other spondylosis with radiculopathy, thoracic region: Secondary | ICD-10-CM | POA: Diagnosis not present

## 2022-07-22 DIAGNOSIS — M47816 Spondylosis without myelopathy or radiculopathy, lumbar region: Secondary | ICD-10-CM | POA: Diagnosis not present

## 2022-07-22 DIAGNOSIS — M9902 Segmental and somatic dysfunction of thoracic region: Secondary | ICD-10-CM | POA: Diagnosis not present

## 2022-08-05 DIAGNOSIS — M47816 Spondylosis without myelopathy or radiculopathy, lumbar region: Secondary | ICD-10-CM | POA: Diagnosis not present

## 2022-08-05 DIAGNOSIS — M9903 Segmental and somatic dysfunction of lumbar region: Secondary | ICD-10-CM | POA: Diagnosis not present

## 2022-08-05 DIAGNOSIS — M4724 Other spondylosis with radiculopathy, thoracic region: Secondary | ICD-10-CM | POA: Diagnosis not present

## 2022-08-05 DIAGNOSIS — M9902 Segmental and somatic dysfunction of thoracic region: Secondary | ICD-10-CM | POA: Diagnosis not present

## 2022-08-17 ENCOUNTER — Encounter: Payer: Self-pay | Admitting: Medical-Surgical

## 2022-08-17 ENCOUNTER — Ambulatory Visit (INDEPENDENT_AMBULATORY_CARE_PROVIDER_SITE_OTHER): Payer: PPO | Admitting: Medical-Surgical

## 2022-08-17 VITALS — BP 116/70 | HR 97 | Temp 98.7°F | Resp 20 | Ht 66.0 in | Wt 192.0 lb

## 2022-08-17 DIAGNOSIS — A6000 Herpesviral infection of urogenital system, unspecified: Secondary | ICD-10-CM

## 2022-08-17 DIAGNOSIS — F411 Generalized anxiety disorder: Secondary | ICD-10-CM

## 2022-08-17 DIAGNOSIS — R051 Acute cough: Secondary | ICD-10-CM

## 2022-08-17 DIAGNOSIS — F5101 Primary insomnia: Secondary | ICD-10-CM | POA: Diagnosis not present

## 2022-08-17 DIAGNOSIS — E782 Mixed hyperlipidemia: Secondary | ICD-10-CM | POA: Diagnosis not present

## 2022-08-17 LAB — POCT INFLUENZA A/B
Influenza A, POC: NEGATIVE
Influenza B, POC: NEGATIVE

## 2022-08-17 LAB — POC COVID19 BINAXNOW: SARS Coronavirus 2 Ag: POSITIVE — AB

## 2022-08-17 MED ORDER — NIRMATRELVIR/RITONAVIR (PAXLOVID)TABLET: 3.0000 | ORAL_TABLET | Freq: Two times a day (BID) | ORAL | 0 refills | Status: AC

## 2022-08-17 NOTE — Progress Notes (Signed)
Established Patient Office Visit  Subjective   Patient ID: Michelle Mathews, female   DOB: 1956-04-10 Age: 66 y.o. MRN: [id number]   Chief Complaint  Patient presents with   Follow-up   HPI Pleasant 66 year old female presenting to today for the following:  Insomnia: taking Dr. Merrilee Jansky sleep formula from [employer] nightly, working well overall. Does still use Lunesta once in a while.  Hyperlipidemia: taking Lovastatin '20mg'$  daily, tolerating well without side effects. Aiming for a low fat diet and limits sugars. Plays pickleball a few times weekly for exercise.   Herpes: taking Valtrex 1,'000mg'$  daily prn for outbreaks, averaging once monthly.   Anxiety: reports symptoms are very well controlled without medications. In a new relationship with an old acquaintance and is very happy right now. No counseling but her new significant other is a PhD in psychology and she feels that he is a very good influence on her.   Reports that she has a bit of a scratchy throat that started within the last 24 hours or so.  Also has a dry, nonproductive cough and mild rhinorrhea.  Does admit that she feels sluggish today but has no other significant symptoms.  Has not COVID tested and does not think she has been in contact with anyone who has flu or COVID.  She did take NyQuil last night which helped with the coughing/rhinorrhea/sleeping.   Objective:    Vitals:   08/17/22 0948  BP: 116/70  Pulse: 97  Temp: 98.7 F (37.1 C)  Resp: 20  Height: '5\' 6"'$  (1.676 m)  Weight: 192 lb 0.6 oz (87.1 kg)  SpO2: 97%  BMI (Calculated): 31.01   Physical Exam Vitals and nursing note reviewed.  Constitutional:      General: She is not in acute distress.    Appearance: Normal appearance. She is not ill-appearing.  HENT:     Head: Normocephalic and atraumatic.     Right Ear: Tympanic membrane, ear canal and external ear normal. There is no impacted cerumen.     Left Ear: Tympanic membrane, ear canal and external ear normal.  There is no impacted cerumen.     Nose: Rhinorrhea present.     Mouth/Throat:     Mouth: Mucous membranes are moist.     Pharynx: Posterior oropharyngeal erythema (mild posterior oropharynx) present. No oropharyngeal exudate.  Eyes:     General: No scleral icterus.       Right eye: No discharge.        Left eye: No discharge.     Extraocular Movements: Extraocular movements intact.     Conjunctiva/sclera: Conjunctivae normal.     Pupils: Pupils are equal, round, and reactive to light.  Cardiovascular:     Rate and Rhythm: Normal rate and regular rhythm.     Pulses: Normal pulses.     Heart sounds: Normal heart sounds.  Pulmonary:     Effort: Pulmonary effort is normal. No respiratory distress.     Breath sounds: Normal breath sounds. No wheezing, rhonchi or rales.  Skin:    General: Skin is warm and dry.  Neurological:     Mental Status: She is alert and oriented to person, place, and time.  Psychiatric:        Mood and Affect: Mood normal.        Behavior: Behavior normal.        Thought Content: Thought content normal.        Judgment: Judgment normal.    Results for orders placed  or performed in visit on 08/17/22 (from the past 24 hour(s))  POC COVID-19     Status: Abnormal   Collection Time: 08/17/22 10:46 AM  Result Value Ref Range   SARS Coronavirus 2 Ag Positive (A) Negative  POCT Influenza A/B     Status: None   Collection Time: 08/17/22 10:46 AM  Result Value Ref Range   Influenza A, POC Negative Negative   Influenza B, POC Negative Negative       The 10-year ASCVD risk score (Arnett DK, et al., 2019) is: 5.3%   Values used to calculate the score:     Age: 71 years     Sex: Female     Is Non-Hispanic African American: No     Diabetic: No     Tobacco smoker: No     Systolic Blood Pressure: 818 mmHg     Is BP treated: No     HDL Cholesterol: 73 mg/dL     Total Cholesterol: 207 mg/dL   Assessment & Plan:   1. Anxiety state Stable without  medication.  2. Primary insomnia Stable using over-the-counter sleep aid from [employer].  Okay to use Lunesta on an as-needed basis if desired.  3. Genital herpes simplex, unspecified site Continue Valtrex 1000 mg daily as needed for outbreaks.  4. Mixed hyperlipidemia Lipids checked approximately 6 months ago looked great.  Continue lovastatin 20 mg daily.  Plan to recheck in 6 months.  5. Acute cough POCT flu negative, POCT COVID positive. Starting Paxlovid. Discussed quarantine and conservative measures for management.   Return in about 6 months (around 02/15/2023) for chronic disease follow up.  ___________________________________________ Clearnce Sorrel, DNP, APRN, FNP-BC [facility] and [place]

## 2022-08-26 ENCOUNTER — Encounter: Payer: Self-pay | Admitting: Medical-Surgical

## 2022-09-01 DIAGNOSIS — M47816 Spondylosis without myelopathy or radiculopathy, lumbar region: Secondary | ICD-10-CM | POA: Diagnosis not present

## 2022-09-01 DIAGNOSIS — M4724 Other spondylosis with radiculopathy, thoracic region: Secondary | ICD-10-CM | POA: Diagnosis not present

## 2022-09-01 DIAGNOSIS — M9903 Segmental and somatic dysfunction of lumbar region: Secondary | ICD-10-CM | POA: Diagnosis not present

## 2022-09-01 DIAGNOSIS — M9902 Segmental and somatic dysfunction of thoracic region: Secondary | ICD-10-CM | POA: Diagnosis not present

## 2022-09-16 ENCOUNTER — Other Ambulatory Visit: Payer: Self-pay | Admitting: Medical-Surgical

## 2022-09-16 DIAGNOSIS — Z1231 Encounter for screening mammogram for malignant neoplasm of breast: Secondary | ICD-10-CM

## 2022-09-22 ENCOUNTER — Ambulatory Visit (INDEPENDENT_AMBULATORY_CARE_PROVIDER_SITE_OTHER): Payer: PPO

## 2022-09-22 DIAGNOSIS — M9903 Segmental and somatic dysfunction of lumbar region: Secondary | ICD-10-CM | POA: Diagnosis not present

## 2022-09-22 DIAGNOSIS — Z1231 Encounter for screening mammogram for malignant neoplasm of breast: Secondary | ICD-10-CM

## 2022-09-22 DIAGNOSIS — M47816 Spondylosis without myelopathy or radiculopathy, lumbar region: Secondary | ICD-10-CM | POA: Diagnosis not present

## 2022-09-22 DIAGNOSIS — M9902 Segmental and somatic dysfunction of thoracic region: Secondary | ICD-10-CM | POA: Diagnosis not present

## 2022-09-22 DIAGNOSIS — M4724 Other spondylosis with radiculopathy, thoracic region: Secondary | ICD-10-CM | POA: Diagnosis not present

## 2022-10-05 LAB — HM DEXA SCAN: HM Dexa Scan: NORMAL

## 2022-10-13 DIAGNOSIS — M4724 Other spondylosis with radiculopathy, thoracic region: Secondary | ICD-10-CM | POA: Diagnosis not present

## 2022-10-13 DIAGNOSIS — M47816 Spondylosis without myelopathy or radiculopathy, lumbar region: Secondary | ICD-10-CM | POA: Diagnosis not present

## 2022-10-13 DIAGNOSIS — M9902 Segmental and somatic dysfunction of thoracic region: Secondary | ICD-10-CM | POA: Diagnosis not present

## 2022-10-13 DIAGNOSIS — M9903 Segmental and somatic dysfunction of lumbar region: Secondary | ICD-10-CM | POA: Diagnosis not present

## 2022-10-16 ENCOUNTER — Other Ambulatory Visit: Payer: Self-pay | Admitting: Medical-Surgical

## 2022-12-16 DIAGNOSIS — M9902 Segmental and somatic dysfunction of thoracic region: Secondary | ICD-10-CM | POA: Diagnosis not present

## 2022-12-16 DIAGNOSIS — M4724 Other spondylosis with radiculopathy, thoracic region: Secondary | ICD-10-CM | POA: Diagnosis not present

## 2022-12-16 DIAGNOSIS — M47816 Spondylosis without myelopathy or radiculopathy, lumbar region: Secondary | ICD-10-CM | POA: Diagnosis not present

## 2022-12-16 DIAGNOSIS — M9903 Segmental and somatic dysfunction of lumbar region: Secondary | ICD-10-CM | POA: Diagnosis not present

## 2023-01-12 DIAGNOSIS — M4724 Other spondylosis with radiculopathy, thoracic region: Secondary | ICD-10-CM | POA: Diagnosis not present

## 2023-01-12 DIAGNOSIS — M47816 Spondylosis without myelopathy or radiculopathy, lumbar region: Secondary | ICD-10-CM | POA: Diagnosis not present

## 2023-01-12 DIAGNOSIS — M9902 Segmental and somatic dysfunction of thoracic region: Secondary | ICD-10-CM | POA: Diagnosis not present

## 2023-01-12 DIAGNOSIS — M9903 Segmental and somatic dysfunction of lumbar region: Secondary | ICD-10-CM | POA: Diagnosis not present

## 2023-02-09 DIAGNOSIS — M47816 Spondylosis without myelopathy or radiculopathy, lumbar region: Secondary | ICD-10-CM | POA: Diagnosis not present

## 2023-02-09 DIAGNOSIS — M4724 Other spondylosis with radiculopathy, thoracic region: Secondary | ICD-10-CM | POA: Diagnosis not present

## 2023-02-09 DIAGNOSIS — M9902 Segmental and somatic dysfunction of thoracic region: Secondary | ICD-10-CM | POA: Diagnosis not present

## 2023-02-09 DIAGNOSIS — M9903 Segmental and somatic dysfunction of lumbar region: Secondary | ICD-10-CM | POA: Diagnosis not present

## 2023-02-15 ENCOUNTER — Ambulatory Visit (INDEPENDENT_AMBULATORY_CARE_PROVIDER_SITE_OTHER): Payer: PPO | Admitting: Medical-Surgical

## 2023-02-15 ENCOUNTER — Encounter: Payer: Self-pay | Admitting: Medical-Surgical

## 2023-02-15 VITALS — BP 134/73 | HR 79 | Resp 20 | Ht 66.0 in | Wt 196.3 lb

## 2023-02-15 DIAGNOSIS — B009 Herpesviral infection, unspecified: Secondary | ICD-10-CM

## 2023-02-15 DIAGNOSIS — R799 Abnormal finding of blood chemistry, unspecified: Secondary | ICD-10-CM

## 2023-02-15 DIAGNOSIS — E782 Mixed hyperlipidemia: Secondary | ICD-10-CM

## 2023-02-15 DIAGNOSIS — Z23 Encounter for immunization: Secondary | ICD-10-CM

## 2023-02-15 DIAGNOSIS — Z0000 Encounter for general adult medical examination without abnormal findings: Secondary | ICD-10-CM | POA: Diagnosis not present

## 2023-02-15 DIAGNOSIS — F411 Generalized anxiety disorder: Secondary | ICD-10-CM

## 2023-02-15 DIAGNOSIS — F5101 Primary insomnia: Secondary | ICD-10-CM | POA: Diagnosis not present

## 2023-02-15 MED ORDER — VALACYCLOVIR HCL 1 G PO TABS: 1000.0000 mg | ORAL_TABLET | Freq: Every day | ORAL | 5 refills | Status: DC | PRN

## 2023-02-15 MED ORDER — LOVASTATIN 20 MG PO TABS: 20.0000 mg | ORAL_TABLET | Freq: Every day | ORAL | 1 refills | Status: DC

## 2023-02-15 NOTE — Progress Notes (Signed)
Established Patient Office Visit  Subjective   Patient ID: Michelle Mathews, female   DOB: 07-19-1956 Age: 67 y.o. MRN: [id number]   Chief Complaint  Patient presents with   Follow-up   HPI 67 year old female presenting today for the following:  Hyperlipidemia: Taking lovastatin 20 mg daily, tolerating well without side effects.  Has questions about being able to eat grapefruit or drink grapefruit juice while on this medication after reading the drug information from the pharmacy.  Following a low-fat heart healthy diet.  Plays pickle ball approximately once weekly.  Insomnia: Previously took [place] on an as-needed basis but no longer uses this medication.  She has been taking a quarter of a CBD gummy nightly which seems to be working beautifully for her.  Seasonal allergies: Taking Claritin 10 mg daily.  Does have a little bit of sneezing now that everything is starting to bloom but is overall well-managed.  Anxiety: Not currently on any medications for management.  Feels that her symptoms are well-managed and there is no further need for intervention at this time.  Denies SI/HI.  HSV: Has not had an outbreak of HSV in quite a while but still has Valtrex for as needed use.   Objective:    Vitals:   02/15/23 1011  BP: 134/73  Pulse: 79  Resp: 20  Height: '5\' 6"'$  (1.676 m)  Weight: 196 lb 4.8 oz (89 kg)  SpO2: 95%  BMI (Calculated): 31.7    Physical Exam Vitals reviewed.  Constitutional:      General: She is not in acute distress.    Appearance: Normal appearance. She is not ill-appearing.  HENT:     Head: Normocephalic and atraumatic.  Cardiovascular:     Rate and Rhythm: Normal rate and regular rhythm.     Pulses: Normal pulses.     Heart sounds: Normal heart sounds.  Pulmonary:     Effort: Pulmonary effort is normal. No respiratory distress.     Breath sounds: Normal breath sounds. No wheezing, rhonchi or rales.  Skin:    General: Skin is warm and dry.  Neurological:      Mental Status: She is alert and oriented to person, place, and time.  Psychiatric:        Mood and Affect: Mood normal.        Behavior: Behavior normal.        Thought Content: Thought content normal.        Judgment: Judgment normal.      No results found for this or any previous visit (from the past 24 hour(s)).     The 10-year ASCVD risk score (Arnett DK, et al., 2019) is: 7.8%   Values used to calculate the score:     Age: 29 years     Sex: Female     Is Non-Hispanic African American: No     Diabetic: No     Tobacco smoker: No     Systolic Blood Pressure: [id number] mmHg     Is BP treated: No     HDL Cholesterol: 73 mg/dL     Total Cholesterol: 207 mg/dL   Assessment & Plan:   1. Anxiety state Stable without medication.   2. Mixed hyperlipidemia Checking labs. Continue Lovastatin '20mg'$  daily. Discussed avoiding grapefruit and grapefruit juice.  - COMPLETE METABOLIC PANEL WITH GFR - Lipid panel  3. Primary insomnia Discontinue Lunesta. Ok to continue nightly gummy if desired.   4. Herpes simplex Continue PRN Valtrex.   5. Preventative  health care Checking CBC with diff.  - CBC with Differential/Platelet  6. Need for COVID-19 vaccine Covid vaccine updated today.  - Pfizer Fall 2023 Covid-19 Vaccine 46yr and older  Return for AWV at your convenience (due 03/17/23); 6 months for chronic disease follow up.  ___________________________________________ JClearnce Sorrel DNP, APRN, FNP-BC [facility] [place]

## 2023-02-16 ENCOUNTER — Other Ambulatory Visit: Payer: Self-pay | Admitting: Medical-Surgical

## 2023-02-16 ENCOUNTER — Encounter: Payer: Self-pay | Admitting: Medical-Surgical

## 2023-02-16 LAB — COMPLETE METABOLIC PANEL WITH GFR
AG Ratio: 1.4 (calc) (ref 1.0–2.5)
ALT: 16 U/L (ref 6–29)
AST: 21 U/L (ref 10–35)
Albumin: 4.2 g/dL (ref 3.6–5.1)
Alkaline phosphatase (APISO): 65 U/L (ref 37–153)
BUN: 9 mg/dL (ref 7–25)
CO2: 30 mmol/L (ref 20–32)
Calcium: 10.6 mg/dL — ABNORMAL HIGH (ref 8.6–10.4)
Chloride: 105 mmol/L (ref 98–110)
Creat: 0.87 mg/dL (ref 0.50–1.05)
Globulin: 2.9 g/dL (calc) (ref 1.9–3.7)
Glucose, Bld: 83 mg/dL (ref 65–99)
Potassium: 5 mmol/L (ref 3.5–5.3)
Sodium: 141 mmol/L (ref 135–146)
Total Bilirubin: 0.4 mg/dL (ref 0.2–1.2)
Total Protein: 7.1 g/dL (ref 6.1–8.1)
eGFR: 73 mL/min/{1.73_m2} (ref >=60)

## 2023-02-16 LAB — CBC WITH DIFFERENTIAL/PLATELET
Absolute Monocytes: 922 cells/uL (ref 200–950)
Basophils Absolute: 86 cells/uL (ref 0–200)
Basophils Relative: 0.9 %
Eosinophils Absolute: 288 cells/uL (ref 15–500)
Eosinophils Relative: 3 %
HCT: 46.9 % — ABNORMAL HIGH (ref 35.0–45.0)
Hemoglobin: 15.5 g/dL (ref 11.7–15.5)
Lymphs Abs: 3110 cells/uL (ref 850–3900)
MCH: 30.3 pg (ref 27.0–33.0)
MCHC: 33 g/dL (ref 32.0–36.0)
MCV: 91.8 fL (ref 80.0–100.0)
MPV: 11.9 fL (ref 7.5–12.5)
Monocytes Relative: 9.6 %
Neutro Abs: 5194 cells/uL (ref 1500–7800)
Neutrophils Relative %: 54.1 %
Platelets: 313 10*3/uL (ref 140–400)
RBC: 5.11 10*6/uL — ABNORMAL HIGH (ref 3.80–5.10)
RDW: 12 % (ref 11.0–15.0)
Total Lymphocyte: 32.4 %
WBC: 9.6 10*3/uL (ref 3.8–10.8)

## 2023-02-16 LAB — LIPID PANEL
Cholesterol: 198 mg/dL (ref <200)
HDL: 71 mg/dL (ref >=50)
LDL Cholesterol (Calc): 100 mg/dL (calc) — ABNORMAL HIGH
Non-HDL Cholesterol (Calc): 127 mg/dL (calc) (ref <130)
Total CHOL/HDL Ratio: 2.8 (calc) (ref <5.0)
Triglycerides: 174 mg/dL — ABNORMAL HIGH (ref <150)

## 2023-02-18 ENCOUNTER — Encounter: Payer: Self-pay | Admitting: Medical-Surgical

## 2023-03-08 DIAGNOSIS — M9903 Segmental and somatic dysfunction of lumbar region: Secondary | ICD-10-CM | POA: Diagnosis not present

## 2023-03-08 DIAGNOSIS — M9902 Segmental and somatic dysfunction of thoracic region: Secondary | ICD-10-CM | POA: Diagnosis not present

## 2023-03-08 DIAGNOSIS — M47816 Spondylosis without myelopathy or radiculopathy, lumbar region: Secondary | ICD-10-CM | POA: Diagnosis not present

## 2023-03-08 DIAGNOSIS — M4724 Other spondylosis with radiculopathy, thoracic region: Secondary | ICD-10-CM | POA: Diagnosis not present

## 2023-04-05 ENCOUNTER — Ambulatory Visit (INDEPENDENT_AMBULATORY_CARE_PROVIDER_SITE_OTHER): Payer: PPO | Admitting: Medical-Surgical

## 2023-04-05 DIAGNOSIS — Z0000 Encounter for general adult medical examination without abnormal findings: Secondary | ICD-10-CM | POA: Diagnosis not present

## 2023-04-05 NOTE — Progress Notes (Signed)
MEDICARE ANNUAL WELLNESS VISIT  04/05/2023  Telephone Visit Disclaimer This Medicare AWV was conducted by telephone due to national recommendations for restrictions regarding the COVID-19 Pandemic (e.g. social distancing).  I verified, using two identifiers, that I am speaking with Michelle Mathews or their authorized healthcare agent. I discussed the limitations, risks, security, and privacy concerns of performing an evaluation and management service by telephone and the potential availability of an in-person appointment in the future. The patient expressed understanding and agreed to proceed.  Location of Patient: home Location of Provider (nurse):  in the office.  Subjective:    Michelle Mathews is a 67 y.o. female patient of Christen Butter, NP who had a Medicare Annual Wellness Visit today via telephone. Michelle Mathews is Retired and lives alone. she does not have any children. she reports that she is socially active and does interact with friends/family regularly. she is moderately physically active and enjoys sewing, pickle ball and posting videos on youtube channel.  Patient Care Team: Christen Butter, NP as PCP - General (Nurse Practitioner)     04/05/2023    1:04 PM 03/15/2022   10:22 AM 03/09/2021    9:58 AM 12/09/2020    2:12 PM  Advanced Directives  Does Patient Have a Medical Advance Directive? Yes Yes Yes Yes  Type of Advance Directive Living will;Healthcare Power of Attorney Living will;Healthcare Power of [employer] Power of [place];Living will Healthcare Power of [place];Living will  Does patient want to make changes to medical advance directive? No - Patient declined No - Patient declined No - Patient declined No - Patient declined  Copy of Healthcare Power of Attorney in Chart? No - copy requested No - copy requested No - copy requested No - copy requested    Hospital Utilization Over the Past 12 Months: # of hospitalizations or ER visits: 0 # of surgeries: 0  Review of Systems     Patient reports that her overall Mathews is unchanged compared to last year.  History obtained from chart review and the patient  Patient Reported Readings (BP, Pulse, CBG, Weight, etc) none  Pain Assessment Pain : No/denies pain     Current Medications & Allergies (verified) Allergies as of 04/05/2023       Reactions   Bee Venom Swelling   Latex Rash   Sunflower Oil Other (See Comments)   Tickling feeling in mouth   Wasp Venom Protein Swelling        Medication List        Accurate as of April 05, 2023  1:11 PM. If you have any questions, ask your nurse or doctor.          STOP taking these medications    CALCIUM PO       TAKE these medications    loratadine 10 MG tablet Commonly known as: CLARITIN Take 1 tablet by mouth daily.   lovastatin 20 MG tablet Commonly known as: MEVACOR Take 1 tablet (20 mg total) by mouth at bedtime.   Move Free Ultra Joint Mathews 40-5-3.3 MG Tabs Generic drug: Collagen-Boron-Hyaluronic Acid Take 1 tablet by mouth daily.   valACYclovir 1000 MG tablet Commonly known as: VALTREX Take 1 tablet (1,000 mg total) by mouth daily as needed.        History (reviewed): Past Medical History:  Diagnosis Date   Allergy Age 50   Anxiety    Arthritis 2010   Herpes simplex    Hyperlipidemia    Insomnia    Morton's neuroma  of both feet    Past Surgical History:  Procedure Laterality Date   AUGMENTATION MAMMAPLASTY Bilateral 1988   Silicone   COLONOSCOPY     COSMETIC SURGERY  1988, 1984   Mammoplasty, rhinoplasty   EXCISION MORTON'S NEUROMA Left    Family History  Problem Relation Age of Onset   Alcohol abuse Mother    Drug abuse Brother    Early death Brother    Cancer Neg Hx    Hyperlipidemia Neg Hx    Hypertension Neg Hx    Anxiety disorder Neg Hx    Depression Neg Hx    Diabetes Neg Hx    Heart disease Neg Hx    Kidney disease Neg Hx    Social History   Socioeconomic History   Marital status:  Divorced    Spouse name: Not on file   Number of children: 0   Years of education: 16   Highest education level: Bachelor's degree (e.g., BA, AB, BS)  Occupational History    Comment: Retired  Tobacco Use   Smoking status: Never   Smokeless tobacco: Never  Vaping Use   Vaping Use: Never used  Substance and Sexual Activity   Alcohol use: Yes    Alcohol/week: 1.0 - 2.0 standard drink of alcohol    Types: 1 - 2 Standard drinks or equivalent per week    Comment: week   Drug use: Yes    Types: Other-see comments    Comment: CBD 1/6-1/4 of one gummy every night for sleep   Sexual activity: Not Currently    Partners: Male    Birth control/protection: Post-menopausal  Other Topics Concern   Not on file  Social History Narrative   Lives alone. 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 She has a little dog that she walks twice daily.    Social Determinants of Mathews   Financial Resource Strain: Low Risk  (03/29/2023)   Overall Financial Resource Strain (CARDIA)    Difficulty of Paying Living Expenses: Not hard at all  Food Insecurity: No Food Insecurity (03/29/2023)   Hunger Vital Sign    Worried About Running Out of Food in the Last Year: Never true    Ran Out of Food in the Last Year: Never true  Transportation Needs: No Transportation Needs (03/29/2023)   PRAPARE - Administrator, Civil Service (Medical): No    Lack of Transportation (Non-Medical): No  Physical Activity: Insufficiently Active (03/29/2023)   Exercise Vital Sign    Days of Exercise per Week: 2 days    Minutes of Exercise per Session: 10 min  Stress: No Stress Concern Present (03/29/2023)   Michelle Mathews - Occupational Stress Questionnaire    Feeling of Stress : Not at all  Social Connections: Moderately Isolated (04/05/2023)   Social Connection and Isolation Panel [NHANES]    Frequency of Communication with Friends and Family: More than three times a week     Frequency of Social Gatherings with Friends and Family: Once a week    Attends Religious Services: Never    Database administrator or Organizations: Yes    Attends Banker Meetings: 1 to 4 times per year    Marital Status: Divorced    Activities of Daily Living    03/29/2023   10:33 AM  In your present state of health, do you have any difficulty performing the following activities:  Hearing? 0  Vision? 0  Difficulty concentrating  or making decisions? 0  Walking or climbing stairs? 0  Dressing or bathing? 0  Doing errands, shopping? 0  Preparing Food and eating ? N  Using the Toilet? N  In the past six months, have you accidently leaked urine? N  Do you have problems with loss of bowel control? N  Managing your Medications? N  Managing your Finances? N  Housekeeping or managing your Housekeeping? N    Patient Education/ Literacy How often do you need to have someone help you when you read instructions, pamphlets, or other written materials from your doctor or pharmacy?: 1 - Never What is the last grade level you completed in school?: Bachelor's degree  Exercise Current Exercise Habits: Home exercise routine, Type of exercise: walking, Time (Minutes): 30, Frequency (Times/Week): 7, Weekly Exercise (Minutes/Week): 210, Intensity: Moderate, Exercise limited by: None identified  Diet Patient reports consuming 2 meals a day and 2 snack(s) a day Patient reports that her primary diet is: Regular Patient reports that she does have regular access to food.   Depression Screen    04/05/2023    1:04 PM 02/15/2023   10:13 AM 08/17/2022    9:54 AM 03/15/2022   10:20 AM 02/12/2022   10:33 AM 03/09/2021    9:59 AM 12/09/2020    2:13 PM  PHQ 2/9 Scores  PHQ - 2 Score 0 0 0 0 0 0 0  PHQ- 9 Score       2     Fall Risk    04/05/2023    1:04 PM 03/29/2023   10:33 AM 02/15/2023   10:13 AM 08/17/2022    9:53 AM 03/14/2022   12:13 PM  Fall Risk   Falls in the past year? 1 1 1 1  0   Number falls in past yr: 0 0 0 0 0  Injury with Fall? 0 0 0 0 0  Comment    Playing pickle ball - lost footing   Risk for fall due to : History of fall(s)  History of fall(s) History of fall(s) No Fall Risks  Follow up Falls evaluation completed  Falls evaluation completed Falls evaluation completed Falls evaluation completed     Objective:  Michelle Mathews seemed alert and oriented and she participated appropriately during our telephone visit.  Blood Pressure Weight BMI  BP Readings from Last 3 Encounters:  02/15/23 134/73  08/17/22 116/70  02/12/22 109/73   Wt Readings from Last 3 Encounters:  02/15/23 196 lb 4.8 oz (89 kg)  08/17/22 192 lb 0.6 oz (87.1 kg)  02/12/22 197 lb (89.4 kg)   BMI Readings from Last 1 Encounters:  02/15/23 31.68 kg/m    *Unable to obtain current vital signs, weight, and BMI due to telephone visit type  Hearing/Vision  Michelle Mathews did not seem to have difficulty with hearing/understanding during the telephone conversation Reports that she has had a formal eye exam by an eye care professional within the past year Reports that she has not had a formal hearing evaluation within the past year *Unable to fully assess hearing and vision during telephone visit type  Cognitive Function:    04/05/2023    1:08 PM 03/15/2022   10:24 AM 03/09/2021   10:13 AM  6CIT Screen  What Year? 0 points 0 points 0 points  What month? 0 points 0 points 0 points  What time? 0 points 0 points 0 points  Count back from 20 0 points 0 points 0 points  Months in reverse 0 points 0  points 0 points  Repeat phrase 0 points 0 points 0 points  Total Score 0 points 0 points 0 points   (Normal:0-7, Significant for Dysfunction: >8)  Normal Cognitive Function Screening: Yes   Immunization & Mathews Maintenance Record Immunization History  Administered Date(s) Administered   COVID-19, mRNA, vaccine(Comirnaty)12 years and older 02/15/2023   Fluad Quad(high Dose 65+) 08/14/2021    Influenza-Unspecified 09/08/2015, 09/13/2016, 09/20/2017, 09/26/2020   Moderna Sars-Covid-2 Vaccination 01/26/2020, 02/24/2020, 09/26/2020, 04/23/2021   PNEUMOCOCCAL CONJUGATE-20 08/14/2021   Pneumococcal Polysaccharide-23 09/10/2019   Tdap 09/18/2014   Unspecified SARS-COV-2 Vaccination 01/26/2020, 02/24/2020   Zoster Recombinat (Shingrix) 09/10/2019, 11/09/2019   Zoster, Live 09/10/2019, 11/09/2019   Zoster, Unspecified 09/10/2019, 11/09/2019    Mathews Maintenance  Topic Date Due   INFLUENZA VACCINE  07/14/2023   Medicare Annual Wellness (AWV)  04/04/2024   DTaP/Tdap/Td (2 - Td or Tdap) 09/18/2024   MAMMOGRAM  09/22/2024   COLONOSCOPY (Pts 45-28yrs Insurance coverage will need to be confirmed)  11/25/2026   Pneumonia Vaccine 77+ Years old  Completed   DEXA SCAN  Completed   COVID-19 Vaccine  Completed   Hepatitis C Screening  Completed   Zoster Vaccines- Shingrix  Completed   HPV VACCINES  Aged Out       Assessment  This is a routine wellness examination for Michelle Mathews.  Mathews Maintenance: Due or Overdue There are no preventive care reminders to display for this patient.   Kalana Woodin does not need a referral for Community Assistance: Care Management:   no Social Work:    no Prescription Assistance:  no Nutrition/Diabetes Education:  no   Plan:  Personalized Goals  Goals Addressed               This Visit's Progress     Patient Stated (pt-stated)        Patient stated she would like to be more active.       Personalized Mathews Maintenance & Screening Recommendations  There are no preventive care reminders to display for this patient.   Lung Cancer Screening Recommended: no (Low Dose CT Chest recommended if Age 32-80 years, 30 pack-year currently smoking OR have quit w/in past 15 years) Hepatitis C Screening recommended: no HIV Screening recommended: no  Advanced Directives: Written information was not prepared per patient's request.  Referrals &  Orders No orders of the defined types were placed in this encounter.   Follow-up Plan Follow-up with Christen Butter, NP as planned Medicare wellness visit in one year.  Patient will access AVS on my chart.   I have personally reviewed and noted the following in the patient's chart:   Medical and social history Use of alcohol, tobacco or illicit drugs  Current medications and supplements Functional ability and status Nutritional status Physical activity Advanced directives List of other physicians Hospitalizations, surgeries, and ER visits in previous 12 months Vitals Screenings to include cognitive, depression, and falls Referrals and appointments  In addition, I have reviewed and discussed with Michelle Mathews certain preventive protocols, quality metrics, and best practice recommendations. A written personalized care plan for preventive services as well as general preventive Mathews recommendations is available and can be mailed to the patient at her request.      Michelle Charon, RN BSN  04/05/2023

## 2023-04-05 NOTE — Patient Instructions (Signed)
MEDICARE ANNUAL WELLNESS VISIT Health Maintenance Summary and Written Plan of Care  Ms. Michelle Mathews ,  Thank you for allowing me to perform your Medicare Annual Wellness Visit and for your ongoing commitment to your health.   Health Maintenance & Immunization History Health Maintenance  Topic Date Due   INFLUENZA VACCINE  07/14/2023   Medicare Annual Wellness (AWV)  04/04/2024   DTaP/Tdap/Td (2 - Td or Tdap) 09/18/2024   MAMMOGRAM  09/22/2024   COLONOSCOPY (Pts 45-43yrs Insurance coverage will need to be confirmed)  11/25/2026   Pneumonia Vaccine 11+ Years old  Completed   DEXA SCAN  Completed   COVID-19 Vaccine  Completed   Hepatitis C Screening  Completed   Zoster Vaccines- Shingrix  Completed   HPV VACCINES  Aged Out   Immunization History  Administered Date(s) Administered   COVID-19, mRNA, vaccine(Comirnaty)12 years and older 02/15/2023   Fluad Quad(high Dose 65+) 08/14/2021   Influenza-Unspecified 09/08/2015, 09/13/2016, 09/20/2017, 09/26/2020   Moderna Sars-Covid-2 Vaccination 01/26/2020, 02/24/2020, 09/26/2020, 04/23/2021   PNEUMOCOCCAL CONJUGATE-20 08/14/2021   Pneumococcal Polysaccharide-23 09/10/2019   Tdap 09/18/2014   Unspecified SARS-COV-2 Vaccination 01/26/2020, 02/24/2020   Zoster Recombinat (Shingrix) 09/10/2019, 11/09/2019   Zoster, Live 09/10/2019, 11/09/2019   Zoster, Unspecified 09/10/2019, 11/09/2019    These are the patient goals that we discussed:  Goals Addressed               This Visit's Progress     Patient Stated (pt-stated)        Patient stated she would like to be more active.         This is a list of Health Maintenance Items that are overdue or due now: There are no preventive care reminders to display for this patient.   Orders/Referrals Placed Today: No orders of the defined types were placed in this encounter.  (Contact our referral department at [phone] if you have not spoken with someone about your referral appointment  within the next 5 days)    Follow-up Plan Follow-up with Christen Butter, NP as planned Medicare wellness visit in one year.  Patient will access AVS on my chart.      Health Maintenance, Female Adopting a healthy lifestyle and getting preventive care are important in promoting health and wellness. Ask your health care provider about: The right schedule for you to have regular tests and exams. Things you can do on your own to prevent diseases and keep yourself healthy. What should I know about diet, weight, and exercise? Eat a healthy diet  Eat a diet that includes plenty of vegetables, fruits, low-fat dairy products, and lean protein. Do not eat a lot of foods that are high in solid fats, added sugars, or sodium. Maintain a healthy weight Body mass index (BMI) is used to identify weight problems. It estimates body fat based on height and weight. Your health care provider can help determine your BMI and help you achieve or maintain a healthy weight. Get regular exercise Get regular exercise. This is one of the most important things you can do for your health. Most adults should: Exercise for at least 150 minutes each week. The exercise should increase your heart rate and make you sweat (moderate-intensity exercise). Do strengthening exercises at least twice a week. This is in addition to the moderate-intensity exercise. Spend less time sitting. Even light physical activity can be beneficial. Watch cholesterol and blood lipids Have your blood tested for lipids and cholesterol at 69 years of age, then have this  test every 5 years. Have your cholesterol levels checked more often if: Your lipid or cholesterol levels are high. You are older than 69 years of age. You are at high risk for heart disease. What should I know about cancer screening? Depending on your health history and family history, you may need to have cancer screening at various ages. This may include screening for: Breast  cancer. Cervical cancer. Colorectal cancer. Skin cancer. Lung cancer. What should I know about heart disease, diabetes, and high blood pressure? Blood pressure and heart disease High blood pressure causes heart disease and increases the risk of stroke. This is more likely to develop in people who have high blood pressure readings or are overweight. Have your blood pressure checked: Every 3-5 years if you are 44-80 years of age. Every year if you are 37 years old or older. Diabetes Have regular diabetes screenings. This checks your fasting blood sugar level. Have the screening done: Once every three years after age 66 if you are at a normal weight and have a low risk for diabetes. More often and at a younger age if you are overweight or have a high risk for diabetes. What should I know about preventing infection? Hepatitis B If you have a higher risk for hepatitis B, you should be screened for this virus. Talk with your health care provider to find out if you are at risk for hepatitis B infection. Hepatitis C Testing is recommended for: Everyone born from [birth year] through 1965. Anyone with known risk factors for hepatitis C. Sexually transmitted infections (STIs) Get screened for STIs, including gonorrhea and chlamydia, if: You are sexually active and are younger than 69 years of age. You are older than 69 years of age and your health care provider tells you that you are at risk for this type of infection. Your sexual activity has changed since you were last screened, and you are at increased risk for chlamydia or gonorrhea. Ask your health care provider if you are at risk. Ask your health care provider about whether you are at high risk for HIV. Your health care provider may recommend a prescription medicine to help prevent HIV infection. If you choose to take medicine to prevent HIV, you should first get tested for HIV. You should then be tested every 3 months for as long as you are taking  the medicine. Pregnancy If you are about to stop having your period (premenopausal) and you may become pregnant, seek counseling before you get pregnant. Take 400 to 800 micrograms (mcg) of folic acid every day if you become pregnant. Ask for birth control (contraception) if you want to prevent pregnancy. Osteoporosis and menopause Osteoporosis is a disease in which the bones lose minerals and strength with aging. This can result in bone fractures. If you are 24 years old or older, or if you are at risk for osteoporosis and fractures, ask your health care provider if you should: Be screened for bone loss. Take a calcium or vitamin D supplement to lower your risk of fractures. Be given hormone replacement therapy (HRT) to treat symptoms of menopause. Follow these instructions at home: Alcohol use Do not drink alcohol if: Your health care provider tells you not to drink. You are pregnant, may be pregnant, or are planning to become pregnant. If you drink alcohol: Limit how much you have to: 0-1 drink a day. Know how much alcohol is in your drink. In the [place] one drink equals one 12 oz bottle of beer (  355 mL), one 5 oz glass of wine (148 mL), or one 1 oz glass of hard liquor (44 mL). Lifestyle Do not use any products that contain nicotine or tobacco. These products include cigarettes, chewing tobacco, and vaping devices, such as e-cigarettes. If you need help quitting, ask your health care provider. Do not use street drugs. Do not share needles. Ask your health care provider for help if you need support or information about quitting drugs. General instructions Schedule regular health, dental, and eye exams. Stay current with your vaccines. Tell your health care provider if: You often feel depressed. You have ever been abused or do not feel safe at home. Summary Adopting a healthy lifestyle and getting preventive care are important in promoting health and wellness. Follow your health  care provider's instructions about healthy diet, exercising, and getting tested or screened for diseases. Follow your health care provider's instructions on monitoring your cholesterol and blood pressure. This information is not intended to replace advice given to you by your health care provider. Make sure you discuss any questions you have with your health care provider. Document Revised: 04/20/2021 Document Reviewed: 04/20/2021 Elsevier Patient Education  2023 [employer].

## 2023-04-12 DIAGNOSIS — M9903 Segmental and somatic dysfunction of lumbar region: Secondary | ICD-10-CM | POA: Diagnosis not present

## 2023-04-12 DIAGNOSIS — M47816 Spondylosis without myelopathy or radiculopathy, lumbar region: Secondary | ICD-10-CM | POA: Diagnosis not present

## 2023-04-12 DIAGNOSIS — M4724 Other spondylosis with radiculopathy, thoracic region: Secondary | ICD-10-CM | POA: Diagnosis not present

## 2023-04-12 DIAGNOSIS — M9902 Segmental and somatic dysfunction of thoracic region: Secondary | ICD-10-CM | POA: Diagnosis not present

## 2023-05-10 DIAGNOSIS — M4724 Other spondylosis with radiculopathy, thoracic region: Secondary | ICD-10-CM | POA: Diagnosis not present

## 2023-05-10 DIAGNOSIS — M9903 Segmental and somatic dysfunction of lumbar region: Secondary | ICD-10-CM | POA: Diagnosis not present

## 2023-05-10 DIAGNOSIS — M9902 Segmental and somatic dysfunction of thoracic region: Secondary | ICD-10-CM | POA: Diagnosis not present

## 2023-05-10 DIAGNOSIS — M47816 Spondylosis without myelopathy or radiculopathy, lumbar region: Secondary | ICD-10-CM | POA: Diagnosis not present

## 2023-06-02 DIAGNOSIS — M47816 Spondylosis without myelopathy or radiculopathy, lumbar region: Secondary | ICD-10-CM | POA: Diagnosis not present

## 2023-06-02 DIAGNOSIS — M9903 Segmental and somatic dysfunction of lumbar region: Secondary | ICD-10-CM | POA: Diagnosis not present

## 2023-06-02 DIAGNOSIS — M4724 Other spondylosis with radiculopathy, thoracic region: Secondary | ICD-10-CM | POA: Diagnosis not present

## 2023-06-02 DIAGNOSIS — M9902 Segmental and somatic dysfunction of thoracic region: Secondary | ICD-10-CM | POA: Diagnosis not present

## 2023-07-06 DIAGNOSIS — M9902 Segmental and somatic dysfunction of thoracic region: Secondary | ICD-10-CM | POA: Diagnosis not present

## 2023-07-06 DIAGNOSIS — M9903 Segmental and somatic dysfunction of lumbar region: Secondary | ICD-10-CM | POA: Diagnosis not present

## 2023-07-06 DIAGNOSIS — M4724 Other spondylosis with radiculopathy, thoracic region: Secondary | ICD-10-CM | POA: Diagnosis not present

## 2023-07-06 DIAGNOSIS — M47816 Spondylosis without myelopathy or radiculopathy, lumbar region: Secondary | ICD-10-CM | POA: Diagnosis not present

## 2023-07-14 ENCOUNTER — Other Ambulatory Visit: Payer: Self-pay | Admitting: Medical-Surgical

## 2023-07-14 ENCOUNTER — Encounter: Payer: Self-pay | Admitting: Medical-Surgical

## 2023-07-14 DIAGNOSIS — Z1231 Encounter for screening mammogram for malignant neoplasm of breast: Secondary | ICD-10-CM

## 2023-08-03 DIAGNOSIS — M4724 Other spondylosis with radiculopathy, thoracic region: Secondary | ICD-10-CM | POA: Diagnosis not present

## 2023-08-03 DIAGNOSIS — M9903 Segmental and somatic dysfunction of lumbar region: Secondary | ICD-10-CM | POA: Diagnosis not present

## 2023-08-03 DIAGNOSIS — M47816 Spondylosis without myelopathy or radiculopathy, lumbar region: Secondary | ICD-10-CM | POA: Diagnosis not present

## 2023-08-03 DIAGNOSIS — M9902 Segmental and somatic dysfunction of thoracic region: Secondary | ICD-10-CM | POA: Diagnosis not present

## 2023-08-23 ENCOUNTER — Ambulatory Visit (INDEPENDENT_AMBULATORY_CARE_PROVIDER_SITE_OTHER): Payer: PPO | Admitting: Medical-Surgical

## 2023-08-23 ENCOUNTER — Encounter: Payer: Self-pay | Admitting: Medical-Surgical

## 2023-08-23 VITALS — BP 115/73 | HR 82 | Resp 20 | Ht 66.0 in | Wt 198.1 lb

## 2023-08-23 DIAGNOSIS — Z23 Encounter for immunization: Secondary | ICD-10-CM

## 2023-08-23 DIAGNOSIS — F411 Generalized anxiety disorder: Secondary | ICD-10-CM | POA: Diagnosis not present

## 2023-08-23 DIAGNOSIS — A6000 Herpesviral infection of urogenital system, unspecified: Secondary | ICD-10-CM | POA: Diagnosis not present

## 2023-08-23 DIAGNOSIS — J302 Other seasonal allergic rhinitis: Secondary | ICD-10-CM | POA: Diagnosis not present

## 2023-08-23 DIAGNOSIS — F5101 Primary insomnia: Secondary | ICD-10-CM | POA: Diagnosis not present

## 2023-08-23 DIAGNOSIS — E782 Mixed hyperlipidemia: Secondary | ICD-10-CM | POA: Diagnosis not present

## 2023-08-23 MED ORDER — LOVASTATIN 20 MG PO TABS: 20.0000 mg | ORAL_TABLET | Freq: Every day | ORAL | 1 refills | Status: DC

## 2023-08-23 NOTE — Progress Notes (Signed)
        Established patient visit  History, exam, impression, and plan:  1. Mixed hyperlipidemia Very pleasant 67 year old female presenting today for follow-up on hyperlipidemia.  She is taking lovastatin 20 mg daily, tolerating well without side effects.  Admits that she has some room for improvement in her dietary habits.  Also admits that she could be exercising more.  Lipids checked in March showed adequate control of cholesterol with normal total cholesterol, excellent HDL, mild elevation of triglycerides on a nonfasting sample, and LDL of 100.  Recommend continuing lovastatin 20 mg daily.  Work on dietary and lifestyle modifications.  2. Seasonal allergies Continues to have issues with seasonal allergies.  She has tried multiple over-the-counter antihistamines and is currently taking Claritin 10 mg daily.  Notes that she does not feel the Claritin is really helpful but none of the others were of benefit.  Hesitant to try nasal sprays as Flonase gave her a severe headache in the past.  Uses a Nettie pot once weekly which seems to help.  Would like to try coming off of the antihistamines to see if her allergy symptoms are manageable without medication.  We did discuss switching every 3 months to a different antihistamine.  Also discussed possible use of Singulair if she finds her symptoms are not well-managed without medication.  She will let me know how this goes and if she would like to try something different.  3. Anxiety state Only rare instances with anxiety lately.  Notes that retirement has helped.  Not currently on any medications and denies desire to start 1.  Feels that her symptoms are well-managed on a daily basis.  4. Primary insomnia Currently taking one quarter of a delta 8 gummy at night which has worked wonderful for her insomnia issues.  Does still have some difficulty falling asleep but once asleep, she gets a full night of rest.  Discussed the use of CBD and delta 8.  As long  as this works for her, she is welcome to continue using it but if it does not work, I would like her to reach out so we can discuss other options.  5. Genital herpes simplex, unspecified site History of genital herpes that affects the area between her buttocks.  Has been taking Valtrex 1000 mg daily as needed for flares.  Has only had 2-3 flares in the last 6 months.  Reports that she can tell when flare is coming on and will take the Valtrex as soon as she notices any symptoms.  The 1 dose of Valtrex seems to help the process which works very well.  Continue Valtrex as prescribed.  Procedures performed this visit: None.  Return in about 6 months (around 02/20/2024) for chronic disease follow up.  __________________________________ Michelle Ohm, DNP, APRN, FNP-BC Primary Care and Sports Medicine [facility]

## 2023-09-07 DIAGNOSIS — M4724 Other spondylosis with radiculopathy, thoracic region: Secondary | ICD-10-CM | POA: Diagnosis not present

## 2023-09-07 DIAGNOSIS — M9902 Segmental and somatic dysfunction of thoracic region: Secondary | ICD-10-CM | POA: Diagnosis not present

## 2023-09-07 DIAGNOSIS — M47816 Spondylosis without myelopathy or radiculopathy, lumbar region: Secondary | ICD-10-CM | POA: Diagnosis not present

## 2023-09-07 DIAGNOSIS — M9903 Segmental and somatic dysfunction of lumbar region: Secondary | ICD-10-CM | POA: Diagnosis not present

## 2023-09-28 ENCOUNTER — Ambulatory Visit: Payer: PPO

## 2023-09-28 DIAGNOSIS — Z1231 Encounter for screening mammogram for malignant neoplasm of breast: Secondary | ICD-10-CM | POA: Diagnosis not present

## 2023-10-12 DIAGNOSIS — M9903 Segmental and somatic dysfunction of lumbar region: Secondary | ICD-10-CM | POA: Diagnosis not present

## 2023-10-12 DIAGNOSIS — M4724 Other spondylosis with radiculopathy, thoracic region: Secondary | ICD-10-CM | POA: Diagnosis not present

## 2023-10-12 DIAGNOSIS — M9902 Segmental and somatic dysfunction of thoracic region: Secondary | ICD-10-CM | POA: Diagnosis not present

## 2023-10-12 DIAGNOSIS — M47816 Spondylosis without myelopathy or radiculopathy, lumbar region: Secondary | ICD-10-CM | POA: Diagnosis not present

## 2023-11-09 DIAGNOSIS — M9903 Segmental and somatic dysfunction of lumbar region: Secondary | ICD-10-CM | POA: Diagnosis not present

## 2023-11-09 DIAGNOSIS — M9902 Segmental and somatic dysfunction of thoracic region: Secondary | ICD-10-CM | POA: Diagnosis not present

## 2023-11-09 DIAGNOSIS — M4724 Other spondylosis with radiculopathy, thoracic region: Secondary | ICD-10-CM | POA: Diagnosis not present

## 2023-11-09 DIAGNOSIS — M47816 Spondylosis without myelopathy or radiculopathy, lumbar region: Secondary | ICD-10-CM | POA: Diagnosis not present

## 2023-12-15 DIAGNOSIS — M9903 Segmental and somatic dysfunction of lumbar region: Secondary | ICD-10-CM | POA: Diagnosis not present

## 2023-12-15 DIAGNOSIS — M47816 Spondylosis without myelopathy or radiculopathy, lumbar region: Secondary | ICD-10-CM | POA: Diagnosis not present

## 2023-12-15 DIAGNOSIS — M9902 Segmental and somatic dysfunction of thoracic region: Secondary | ICD-10-CM | POA: Diagnosis not present

## 2023-12-15 DIAGNOSIS — M4724 Other spondylosis with radiculopathy, thoracic region: Secondary | ICD-10-CM | POA: Diagnosis not present

## 2024-01-12 DIAGNOSIS — M4724 Other spondylosis with radiculopathy, thoracic region: Secondary | ICD-10-CM | POA: Diagnosis not present

## 2024-01-12 DIAGNOSIS — M9903 Segmental and somatic dysfunction of lumbar region: Secondary | ICD-10-CM | POA: Diagnosis not present

## 2024-01-12 DIAGNOSIS — M9902 Segmental and somatic dysfunction of thoracic region: Secondary | ICD-10-CM | POA: Diagnosis not present

## 2024-01-12 DIAGNOSIS — M47816 Spondylosis without myelopathy or radiculopathy, lumbar region: Secondary | ICD-10-CM | POA: Diagnosis not present

## 2024-02-16 DIAGNOSIS — M9903 Segmental and somatic dysfunction of lumbar region: Secondary | ICD-10-CM | POA: Diagnosis not present

## 2024-02-16 DIAGNOSIS — M9902 Segmental and somatic dysfunction of thoracic region: Secondary | ICD-10-CM | POA: Diagnosis not present

## 2024-02-16 DIAGNOSIS — M47816 Spondylosis without myelopathy or radiculopathy, lumbar region: Secondary | ICD-10-CM | POA: Diagnosis not present

## 2024-02-16 DIAGNOSIS — M4724 Other spondylosis with radiculopathy, thoracic region: Secondary | ICD-10-CM | POA: Diagnosis not present

## 2024-02-21 ENCOUNTER — Ambulatory Visit: Payer: PPO | Admitting: Medical-Surgical

## 2024-03-01 ENCOUNTER — Encounter: Payer: Self-pay | Admitting: Medical-Surgical

## 2024-03-01 ENCOUNTER — Ambulatory Visit (INDEPENDENT_AMBULATORY_CARE_PROVIDER_SITE_OTHER): Admitting: Medical-Surgical

## 2024-03-01 VITALS — BP 123/71 | HR 71 | Resp 20 | Ht 66.0 in | Wt 201.0 lb

## 2024-03-01 DIAGNOSIS — A6000 Herpesviral infection of urogenital system, unspecified: Secondary | ICD-10-CM

## 2024-03-01 DIAGNOSIS — Z0000 Encounter for general adult medical examination without abnormal findings: Secondary | ICD-10-CM | POA: Diagnosis not present

## 2024-03-01 DIAGNOSIS — E782 Mixed hyperlipidemia: Secondary | ICD-10-CM

## 2024-03-01 MED ORDER — MONTELUKAST SODIUM 10 MG PO TABS: 10.0000 mg | ORAL_TABLET | Freq: Every day | ORAL | 1 refills | Status: DC

## 2024-03-01 MED ORDER — LOVASTATIN 20 MG PO TABS
20.0000 mg | ORAL_TABLET | Freq: Every day | ORAL | 1 refills | Status: DC
Start: 2024-03-01 — End: 2024-08-30

## 2024-03-01 MED ORDER — VALACYCLOVIR HCL 1 G PO TABS: 1000.0000 mg | ORAL_TABLET | Freq: Every day | ORAL | 5 refills | Status: AC | PRN

## 2024-03-01 NOTE — Progress Notes (Unsigned)
        Established patient visit  History, exam, impression, and plan:  1. Mixed hyperlipidemia (Primary) Very pleasant 68 year old female presenting today with history of mixed hyperlipidemia that has been treated with lovastatin 20 mg daily.  Tolerates the medication well without side effects.  Works to follow a low-fat heart healthy diet.  Staying physically active and plays pickle ball 2-3 times weekly.  Rechecking labs today.  Continue lovastatin as prescribed. - lovastatin (MEVACOR) 20 MG tablet; Take 1 tablet (20 mg total) by mouth at bedtime.  Dispense: 90 tablet; Refill: 1 - CMP14+EGFR - Lipid panel  2. Genital herpes simplex, unspecified site Has a history of genital herpes however reports that she has had no outbreak in months.  Takes Valtrex as needed at the first sign of symptoms and reports this works very well.  Refilling Valtrex for as needed use. - valACYclovir (VALTREX) 1000 MG tablet; Take 1 tablet (1,000 mg total) by mouth daily as needed.  Dispense: 20 tablet; Refill: 5  3. Preventative health care Checking CBC today. - CBC  Procedures performed this visit: None.  Return in about 6 months (around 09/01/2024) for chronic disease follow up.  __________________________________ Thayer Ohm, DNP, APRN, FNP-BC Primary Care and Sports Medicine [facility] [place]

## 2024-03-01 NOTE — Patient Instructions (Signed)
 GERD in Adults: Diet Changes When you have gastroesophageal reflux disease (GERD), you may need to make changes to your diet. Choosing the right foods can help with your symptoms. Think about working with an expert in healthy eating called a dietitian. They can help you make healthy food choices. What are tips for following this plan? Reading food labels Look for foods that are low in saturated fat. Foods that may help with your symptoms include: Foods with less than 5% of daily value (DV) of fat. Foods with 0 grams of trans fat. Cooking [employer] in ways that don't use a lot of fat. These ways include: Baking. Steaming. Grilling. Broiling. To add flavor, try to use herbs that are low in spice and acidity. Avoid frying your food. Meal planning  Eat small meals often rather than eating 3 large meals each day. Eat your meals slowly in a place where you feel relaxed. If told by your health care provider, avoid: Foods that cause symptoms. Keep a food diary to keep track of foods that cause symptoms. Alcohol. Drinking a lot of liquid with meals. General instructions For 2-3 hours after you eat, avoid: Bending over. Exercise. Lying down. Chew sugar-free gum after meals. What foods should I eat? Eat a healthy diet. Try to include: Foods with high amounts of fiber. These include: Fruits and vegetables. Whole grains and beans. Low-fat dairy products. Lean meats, fish, and poultry. Egg whites. Foods that cause symptoms in someone else may not cause symptoms for you. Work with your provider to find foods that are safe for you. The items listed above may not be all the foods and drinks you can have. Talk with a dietitian to learn more. The items listed above may not be a complete list of foods and beverages you can eat and drink. Contact a dietitian for more information. What foods should I avoid? Limiting some of these foods may help with your symptoms. Each person is different.  Talk with a dietitian or your provider to help you find the exact foods to avoid. Some of the foods to avoid may include: Fruits Fruits with a lot of acid in them. These may include citrus fruits, such as oranges, grapefruit, pineapple, and lemons. Vegetables Deep-fried vegetables, such as [place] fries. Vegetables, sauces, or toppings made with added fat and vegetables with acid in them. These may include tomatoes and tomato products, chili peppers, onions, garlic, and horseradish. Grains Pastries or quick breads with added fat. Meats and other proteins High-fat meats, such as fatty beef or pork, hot dogs, ribs, ham, sausage, salami, and bacon. Fried meat or protein, such as fried fish and fried chicken. Egg yolks. Fats and oils Butter. Margarine. Shortening. Ghee. Drinks Coffee and other drinks with caffeine in them. Fizzy and sugary drinks, such as soda and energy drinks. Fruit juice made with acidic fruits, such as orange or grapefruit. Tomato juice. Sweets and desserts Chocolate and cocoa. Donuts. Seasonings and condiments Mint, such as peppermint and spearmint. Condiments, herbs, or seasonings that cause symptoms. These may include curry, hot sauce, or vinegar-based salad dressings. The items listed above may not be all the foods and drinks you should avoid. Talk with a dietitian to learn more. Questions to ask your health care provider Changes to your diet and everyday life are often the first steps taken to manage symptoms of GERD. If these changes don't help, talk with your provider about taking medicines. Where to find more information [employer]:  aboutgerd.org This information is not intended to replace advice given to you by your health care provider. Make sure you discuss any questions you have with your health care provider. Document Revised: 10/11/2023 Document Reviewed: 04/27/2023 Elsevier Patient Education  2024 [employer].

## 2024-03-02 ENCOUNTER — Encounter: Payer: Self-pay | Admitting: Medical-Surgical

## 2024-03-02 LAB — CBC
Hematocrit: 46.2 % (ref 34.0–46.6)
Hemoglobin: 15.1 g/dL (ref 11.1–15.9)
MCH: 29.8 pg (ref 26.6–33.0)
MCHC: 32.7 g/dL (ref 31.5–35.7)
MCV: 91 fL (ref 79–97)
Platelets: 343 10*3/uL (ref 150–450)
RBC: 5.06 x10E6/uL (ref 3.77–5.28)
RDW: 12.6 % (ref 11.7–15.4)
WBC: 10.8 10*3/uL (ref 3.4–10.8)

## 2024-03-02 LAB — CMP14+EGFR
ALT: 21 IU/L (ref 0–32)
AST: 31 IU/L (ref 0–40)
Albumin: 4.2 g/dL (ref 3.9–4.9)
Alkaline Phosphatase: 73 IU/L (ref 44–121)
BUN/Creatinine Ratio: 7 — ABNORMAL LOW (ref 12–28)
BUN: 6 mg/dL — ABNORMAL LOW (ref 8–27)
Bilirubin Total: 0.3 mg/dL (ref 0.0–1.2)
CO2: 25 mmol/L (ref 20–29)
Calcium: 10.2 mg/dL (ref 8.7–10.3)
Chloride: 103 mmol/L (ref 96–106)
Creatinine, Ser: 0.87 mg/dL (ref 0.57–1.00)
Globulin, Total: 2.9 g/dL (ref 1.5–4.5)
Glucose: 83 mg/dL (ref 70–99)
Potassium: 4.9 mmol/L (ref 3.5–5.2)
Sodium: 142 mmol/L (ref 134–144)
Total Protein: 7.1 g/dL (ref 6.0–8.5)
eGFR: 72 mL/min/{1.73_m2} (ref >59)

## 2024-03-02 LAB — LIPID PANEL
Chol/HDL Ratio: 2.8 ratio (ref 0.0–4.4)
Cholesterol, Total: 201 mg/dL — ABNORMAL HIGH (ref 100–199)
HDL: 72 mg/dL (ref >39)
LDL Chol Calc (NIH): 100 mg/dL — ABNORMAL HIGH (ref 0–99)
Triglycerides: 170 mg/dL — ABNORMAL HIGH (ref 0–149)
VLDL Cholesterol Cal: 29 mg/dL (ref 5–40)

## 2024-03-19 DIAGNOSIS — M9902 Segmental and somatic dysfunction of thoracic region: Secondary | ICD-10-CM | POA: Diagnosis not present

## 2024-03-19 DIAGNOSIS — M4724 Other spondylosis with radiculopathy, thoracic region: Secondary | ICD-10-CM | POA: Diagnosis not present

## 2024-03-19 DIAGNOSIS — M9903 Segmental and somatic dysfunction of lumbar region: Secondary | ICD-10-CM | POA: Diagnosis not present

## 2024-03-19 DIAGNOSIS — M47816 Spondylosis without myelopathy or radiculopathy, lumbar region: Secondary | ICD-10-CM | POA: Diagnosis not present

## 2024-03-24 ENCOUNTER — Other Ambulatory Visit: Payer: Self-pay | Admitting: Medical-Surgical

## 2024-04-11 DIAGNOSIS — M4724 Other spondylosis with radiculopathy, thoracic region: Secondary | ICD-10-CM | POA: Diagnosis not present

## 2024-04-11 DIAGNOSIS — M9902 Segmental and somatic dysfunction of thoracic region: Secondary | ICD-10-CM | POA: Diagnosis not present

## 2024-04-11 DIAGNOSIS — M47816 Spondylosis without myelopathy or radiculopathy, lumbar region: Secondary | ICD-10-CM | POA: Diagnosis not present

## 2024-04-11 DIAGNOSIS — M9903 Segmental and somatic dysfunction of lumbar region: Secondary | ICD-10-CM | POA: Diagnosis not present

## 2024-05-10 DIAGNOSIS — M47816 Spondylosis without myelopathy or radiculopathy, lumbar region: Secondary | ICD-10-CM | POA: Diagnosis not present

## 2024-05-10 DIAGNOSIS — M4724 Other spondylosis with radiculopathy, thoracic region: Secondary | ICD-10-CM | POA: Diagnosis not present

## 2024-05-10 DIAGNOSIS — M9902 Segmental and somatic dysfunction of thoracic region: Secondary | ICD-10-CM | POA: Diagnosis not present

## 2024-05-10 DIAGNOSIS — M9903 Segmental and somatic dysfunction of lumbar region: Secondary | ICD-10-CM | POA: Diagnosis not present

## 2024-05-31 DIAGNOSIS — M4724 Other spondylosis with radiculopathy, thoracic region: Secondary | ICD-10-CM | POA: Diagnosis not present

## 2024-05-31 DIAGNOSIS — M9903 Segmental and somatic dysfunction of lumbar region: Secondary | ICD-10-CM | POA: Diagnosis not present

## 2024-05-31 DIAGNOSIS — M47816 Spondylosis without myelopathy or radiculopathy, lumbar region: Secondary | ICD-10-CM | POA: Diagnosis not present

## 2024-05-31 DIAGNOSIS — M9902 Segmental and somatic dysfunction of thoracic region: Secondary | ICD-10-CM | POA: Diagnosis not present

## 2024-06-21 DIAGNOSIS — M9903 Segmental and somatic dysfunction of lumbar region: Secondary | ICD-10-CM | POA: Diagnosis not present

## 2024-06-21 DIAGNOSIS — M4724 Other spondylosis with radiculopathy, thoracic region: Secondary | ICD-10-CM | POA: Diagnosis not present

## 2024-06-21 DIAGNOSIS — M47816 Spondylosis without myelopathy or radiculopathy, lumbar region: Secondary | ICD-10-CM | POA: Diagnosis not present

## 2024-06-21 DIAGNOSIS — M9902 Segmental and somatic dysfunction of thoracic region: Secondary | ICD-10-CM | POA: Diagnosis not present

## 2024-07-12 DIAGNOSIS — M4724 Other spondylosis with radiculopathy, thoracic region: Secondary | ICD-10-CM | POA: Diagnosis not present

## 2024-07-12 DIAGNOSIS — M9903 Segmental and somatic dysfunction of lumbar region: Secondary | ICD-10-CM | POA: Diagnosis not present

## 2024-07-12 DIAGNOSIS — M47816 Spondylosis without myelopathy or radiculopathy, lumbar region: Secondary | ICD-10-CM | POA: Diagnosis not present

## 2024-07-12 DIAGNOSIS — M9902 Segmental and somatic dysfunction of thoracic region: Secondary | ICD-10-CM | POA: Diagnosis not present

## 2024-08-09 DIAGNOSIS — M9903 Segmental and somatic dysfunction of lumbar region: Secondary | ICD-10-CM | POA: Diagnosis not present

## 2024-08-09 DIAGNOSIS — M4724 Other spondylosis with radiculopathy, thoracic region: Secondary | ICD-10-CM | POA: Diagnosis not present

## 2024-08-09 DIAGNOSIS — M9902 Segmental and somatic dysfunction of thoracic region: Secondary | ICD-10-CM | POA: Diagnosis not present

## 2024-08-09 DIAGNOSIS — M47816 Spondylosis without myelopathy or radiculopathy, lumbar region: Secondary | ICD-10-CM | POA: Diagnosis not present

## 2024-08-30 ENCOUNTER — Ambulatory Visit (INDEPENDENT_AMBULATORY_CARE_PROVIDER_SITE_OTHER): Admitting: Medical-Surgical

## 2024-08-30 ENCOUNTER — Encounter: Payer: Self-pay | Admitting: Medical-Surgical

## 2024-08-30 VITALS — BP 124/70 | HR 68 | Resp 20 | Ht 66.0 in | Wt 192.0 lb

## 2024-08-30 DIAGNOSIS — Z23 Encounter for immunization: Secondary | ICD-10-CM

## 2024-08-30 DIAGNOSIS — L658 Other specified nonscarring hair loss: Secondary | ICD-10-CM | POA: Diagnosis not present

## 2024-08-30 DIAGNOSIS — E782 Mixed hyperlipidemia: Secondary | ICD-10-CM | POA: Diagnosis not present

## 2024-08-30 DIAGNOSIS — A6000 Herpesviral infection of urogenital system, unspecified: Secondary | ICD-10-CM

## 2024-08-30 DIAGNOSIS — Z780 Asymptomatic menopausal state: Secondary | ICD-10-CM | POA: Insufficient documentation

## 2024-08-30 DIAGNOSIS — Z1231 Encounter for screening mammogram for malignant neoplasm of breast: Secondary | ICD-10-CM

## 2024-08-30 DIAGNOSIS — F5101 Primary insomnia: Secondary | ICD-10-CM | POA: Diagnosis not present

## 2024-08-30 MED ORDER — LOVASTATIN 20 MG PO TABS: 20.0000 mg | ORAL_TABLET | Freq: Every day | ORAL | 3 refills | Status: AC

## 2024-08-30 MED ORDER — COVID-19 MRNA VACC (MODERNA) 50 MCG/0.5ML IM SUSY: 0.5000 mL | PREFILLED_SYRINGE | Freq: Once | INTRAMUSCULAR | 0 refills | Status: AC

## 2024-08-30 MED ORDER — FINASTERIDE 5 MG PO TABS
2.5000 mg | ORAL_TABLET | Freq: Every day | ORAL | 2 refills | Status: AC
Start: 2024-08-30 — End: ?

## 2024-08-30 NOTE — Assessment & Plan Note (Signed)
 Long-term use of Valtrex  1000 mg daily as needed for outbreaks, effective with no recent concerns. -Continue Valtrex  daily as needed.

## 2024-08-30 NOTE — Assessment & Plan Note (Signed)
 History of insomnia however this has become far more manageable and she no longer needs medications to help her sleep.  Regular intentional exercise is helping. - Continue regular intentional exercise and lifestyle modifications. - Continue sleep hygiene.

## 2024-08-30 NOTE — Progress Notes (Signed)
 y       Established patient visit   History of Present Illness   Discussed the use of AI scribe software for clinical note transcription with the patient, who gave verbal consent to proceed.  History of Present Illness   Michelle Mathews is a 68 year old female who presents for a general follow-up on cholesterol management.  Hyperlipidemia management - Takes lovastatin  20 mg daily for cholesterol management - No joint or muscle aches with statin therapy - Also takes 'Move Free Ultra' daily and a multivitamin  Physical activity - Plays pickleball two to three times per week - Attends body balance class at the [facility], including Tai Chi, yoga, and Pilates  Dietary and weight changes - Working on improving diet - Weight decreased from 201 pounds to 192 pounds, attributed to increased physical activity and dietary changes   Hair loss - Has been occurring gradually for several months - History of hair pulling but this stopped in her younger years - Using a colored powder to help cover up health and her hair is cotton along the top and crown      Physical Exam   Physical Exam Vitals reviewed.  Constitutional:      General: She is not in acute distress.    Appearance: Normal appearance. She is not ill-appearing.  HENT:     Head: Normocephalic and atraumatic.  Cardiovascular:     Rate and Rhythm: Normal rate and regular rhythm.     Pulses: Normal pulses.     Heart sounds: Normal heart sounds. No murmur heard.    No friction rub. No gallop.  Pulmonary:     Effort: Pulmonary effort is normal. No respiratory distress.     Breath sounds: Normal breath sounds. No wheezing.  Skin:    General: Skin is warm and dry.  Neurological:     Mental Status: She is alert and oriented to person, place, and time.  Psychiatric:        Mood and Affect: Mood normal.        Behavior: Behavior normal.        Thought Content: Thought content normal.        Judgment: Judgment normal.    Assessment  & Plan   Problem List Items Addressed This Visit       Musculoskeletal and Integument   Female pattern hair loss   Significant thinning on top and front of scalp, possibly stress-related but suspect hormonal in nature. History of trichotillomania resolved. - Prescribed finasteride  2.5 mg daily. - Discussed potential side effects, including breast tenderness. - Consider topical minoxidil if finasteride  ineffective.        Genitourinary   Genital herpes   Long-term use of Valtrex  1000 mg daily as needed for outbreaks, effective with no recent concerns. -Continue Valtrex  daily as needed.      Relevant Medications   COVID-19 mRNA vaccine (SPIKEVAX) syringe     Other   Hyperlipidemia - Primary   Managed with lovastatin  20 mg daily. Recent labs showed cholesterol slightly elevated. - Continue lovastatin . - Encouraged increased physical activity and dietary modifications. - Plan blood work in six months.       Relevant Medications   lovastatin  (MEVACOR ) 20 MG tablet   Insomnia   History of insomnia however this has become far more manageable and she no longer needs medications to help her sleep.  Regular intentional exercise is helping. - Continue regular intentional exercise and lifestyle modifications. - Continue sleep hygiene.  Postmenopausal   Last DEXA scan done in October 2023.  Due for reevaluation next month. - DEXA scan ordered.      Relevant Orders   DG Bone Density   Other Visit Diagnoses       Encounter for screening mammogram for malignant neoplasm of breast       Relevant Orders   MM DIGITAL SCREENING BILATERAL     Need for influenza vaccination       Relevant Orders   Flu vaccine HIGH DOSE PF(Fluzone Trivalent) (Completed)       Follow up   Return in about 6 months (around 02/27/2025) for chronic disease follow up. __________________________________ Zada FREDRIK Palin, DNP, APRN, FNP-BC Primary Care and Sports Medicine [facility]  [place]

## 2024-08-30 NOTE — Assessment & Plan Note (Signed)
 Last DEXA scan done in October 2023.  Due for reevaluation next month. - DEXA scan ordered.

## 2024-08-30 NOTE — Assessment & Plan Note (Signed)
 Significant thinning on top and front of scalp, possibly stress-related but suspect hormonal in nature. History of trichotillomania resolved. - Prescribed finasteride  2.5 mg daily. - Discussed potential side effects, including breast tenderness. - Consider topical minoxidil if finasteride  ineffective.

## 2024-08-30 NOTE — Assessment & Plan Note (Signed)
 Managed with lovastatin  20 mg daily. Recent labs showed cholesterol slightly elevated. - Continue lovastatin . - Encouraged increased physical activity and dietary modifications. - Plan blood work in six months.

## 2024-09-03 DIAGNOSIS — M4724 Other spondylosis with radiculopathy, thoracic region: Secondary | ICD-10-CM | POA: Diagnosis not present

## 2024-09-03 DIAGNOSIS — M47816 Spondylosis without myelopathy or radiculopathy, lumbar region: Secondary | ICD-10-CM | POA: Diagnosis not present

## 2024-09-03 DIAGNOSIS — M9902 Segmental and somatic dysfunction of thoracic region: Secondary | ICD-10-CM | POA: Diagnosis not present

## 2024-09-03 DIAGNOSIS — M9903 Segmental and somatic dysfunction of lumbar region: Secondary | ICD-10-CM | POA: Diagnosis not present

## 2024-09-26 DIAGNOSIS — M9903 Segmental and somatic dysfunction of lumbar region: Secondary | ICD-10-CM | POA: Diagnosis not present

## 2024-09-26 DIAGNOSIS — M9902 Segmental and somatic dysfunction of thoracic region: Secondary | ICD-10-CM | POA: Diagnosis not present

## 2024-09-26 DIAGNOSIS — M4724 Other spondylosis with radiculopathy, thoracic region: Secondary | ICD-10-CM | POA: Diagnosis not present

## 2024-09-26 DIAGNOSIS — M47816 Spondylosis without myelopathy or radiculopathy, lumbar region: Secondary | ICD-10-CM | POA: Diagnosis not present

## 2024-10-23 DIAGNOSIS — M4724 Other spondylosis with radiculopathy, thoracic region: Secondary | ICD-10-CM | POA: Diagnosis not present

## 2024-10-23 DIAGNOSIS — M9902 Segmental and somatic dysfunction of thoracic region: Secondary | ICD-10-CM | POA: Diagnosis not present

## 2024-10-23 DIAGNOSIS — M9903 Segmental and somatic dysfunction of lumbar region: Secondary | ICD-10-CM | POA: Diagnosis not present

## 2024-10-23 DIAGNOSIS — M47816 Spondylosis without myelopathy or radiculopathy, lumbar region: Secondary | ICD-10-CM | POA: Diagnosis not present

## 2024-11-14 ENCOUNTER — Ambulatory Visit

## 2024-11-14 ENCOUNTER — Other Ambulatory Visit: Payer: Self-pay | Admitting: Medical-Surgical

## 2024-11-14 ENCOUNTER — Ambulatory Visit: Payer: Self-pay | Admitting: Medical-Surgical

## 2024-11-14 DIAGNOSIS — Z780 Asymptomatic menopausal state: Secondary | ICD-10-CM | POA: Diagnosis not present

## 2024-11-14 DIAGNOSIS — Z13820 Encounter for screening for osteoporosis: Secondary | ICD-10-CM

## 2024-11-14 DIAGNOSIS — Z1231 Encounter for screening mammogram for malignant neoplasm of breast: Secondary | ICD-10-CM

## 2024-11-22 ENCOUNTER — Other Ambulatory Visit: Payer: Self-pay | Admitting: Medical Genetics

## 2024-11-29 ENCOUNTER — Other Ambulatory Visit: Payer: Self-pay | Admitting: Medical-Surgical

## 2024-11-29 ENCOUNTER — Encounter

## 2024-11-29 DIAGNOSIS — Z1231 Encounter for screening mammogram for malignant neoplasm of breast: Secondary | ICD-10-CM | POA: Diagnosis not present

## 2024-12-04 ENCOUNTER — Ambulatory Visit: Payer: Self-pay | Admitting: Medical-Surgical

## 2025-01-10 ENCOUNTER — Other Ambulatory Visit: Payer: Self-pay

## 2025-01-10 DIAGNOSIS — Z006 Encounter for examination for normal comparison and control in clinical research program: Secondary | ICD-10-CM

## 2025-02-27 ENCOUNTER — Ambulatory Visit: Admitting: Medical-Surgical
